# Patient Record
Sex: Female | Born: 1945
Health system: Southern US, Community
[De-identification: ages and names within clinical notes are randomized; demographics above are authoritative.]

## PROBLEM LIST (undated history)

## (undated) DIAGNOSIS — M1990 Unspecified osteoarthritis, unspecified site: Secondary | ICD-10-CM

---

## 1998-06-30 ENCOUNTER — Other Ambulatory Visit: Admission: RE | Admit: 1998-06-30 | Discharge: 1998-06-30 | Payer: Self-pay | Admitting: Family Medicine

## 1999-10-25 ENCOUNTER — Encounter: Payer: Self-pay | Admitting: Orthopedic Surgery

## 1999-11-01 ENCOUNTER — Inpatient Hospital Stay (HOSPITAL_COMMUNITY): Admission: RE | Admit: 1999-11-01 | Discharge: 1999-11-05 | Payer: Self-pay | Admitting: Orthopedic Surgery

## 1999-11-01 ENCOUNTER — Encounter: Payer: Self-pay | Admitting: Orthopedic Surgery

## 2000-11-01 ENCOUNTER — Other Ambulatory Visit: Admission: RE | Admit: 2000-11-01 | Discharge: 2000-11-01 | Payer: Self-pay | Admitting: Family Medicine

## 2002-04-21 ENCOUNTER — Other Ambulatory Visit: Admission: RE | Admit: 2002-04-21 | Discharge: 2002-04-21 | Payer: Self-pay | Admitting: Family Medicine

## 2002-10-24 ENCOUNTER — Encounter: Payer: Self-pay | Admitting: Emergency Medicine

## 2002-10-24 ENCOUNTER — Emergency Department (HOSPITAL_COMMUNITY): Admission: EM | Admit: 2002-10-24 | Discharge: 2002-10-24 | Payer: Self-pay | Admitting: Emergency Medicine

## 2003-06-08 ENCOUNTER — Other Ambulatory Visit: Admission: RE | Admit: 2003-06-08 | Discharge: 2003-06-08 | Payer: Self-pay | Admitting: Family Medicine

## 2004-10-10 ENCOUNTER — Other Ambulatory Visit: Admission: RE | Admit: 2004-10-10 | Discharge: 2004-10-10 | Payer: Self-pay | Admitting: Family Medicine

## 2017-10-07 ENCOUNTER — Emergency Department (HOSPITAL_COMMUNITY)
Admission: EM | Admit: 2017-10-07 | Discharge: 2017-10-07 | Disposition: A | Discharge status: Home / Self Care | Payer: Medicare Other | Attending: Emergency Medicine | Admitting: Emergency Medicine

## 2017-10-07 ENCOUNTER — Emergency Department (HOSPITAL_COMMUNITY): Payer: Medicare Other

## 2017-10-07 ENCOUNTER — Encounter (HOSPITAL_COMMUNITY): Payer: Self-pay | Admitting: Pharmacy Technician

## 2017-10-07 DIAGNOSIS — S0990XA Unspecified injury of head, initial encounter: Secondary | ICD-10-CM

## 2017-10-07 DIAGNOSIS — W010XXA Fall on same level from slipping, tripping and stumbling without subsequent striking against object, initial encounter: Secondary | ICD-10-CM | POA: Insufficient documentation

## 2017-10-07 DIAGNOSIS — R5383 Other fatigue: Secondary | ICD-10-CM | POA: Diagnosis present

## 2017-10-07 DIAGNOSIS — R600 Localized edema: Secondary | ICD-10-CM

## 2017-10-07 DIAGNOSIS — K7469 Other cirrhosis of liver: Secondary | ICD-10-CM

## 2017-10-07 DIAGNOSIS — R2243 Localized swelling, mass and lump, lower limb, bilateral: Secondary | ICD-10-CM | POA: Diagnosis not present

## 2017-10-07 DIAGNOSIS — E876 Hypokalemia: Secondary | ICD-10-CM | POA: Diagnosis not present

## 2017-10-07 DIAGNOSIS — Y929 Unspecified place or not applicable: Secondary | ICD-10-CM | POA: Diagnosis not present

## 2017-10-07 DIAGNOSIS — R7989 Other specified abnormal findings of blood chemistry: Secondary | ICD-10-CM

## 2017-10-07 DIAGNOSIS — Y939 Activity, unspecified: Secondary | ICD-10-CM | POA: Insufficient documentation

## 2017-10-07 DIAGNOSIS — R945 Abnormal results of liver function studies: Secondary | ICD-10-CM | POA: Diagnosis not present

## 2017-10-07 DIAGNOSIS — Y999 Unspecified external cause status: Secondary | ICD-10-CM | POA: Diagnosis not present

## 2017-10-07 LAB — I-STAT TROPONIN, ED: Troponin i, poc: 0.03 ng/mL (ref 0.00–0.08)

## 2017-10-07 LAB — CBC WITH DIFFERENTIAL/PLATELET
BASOS PCT: 0 %
Basophils Absolute: 0 10*3/uL (ref 0.0–0.1)
EOS PCT: 0 %
Eosinophils Absolute: 0 10*3/uL (ref 0.0–0.7)
HEMATOCRIT: 30.4 % — AB (ref 36.0–46.0)
HEMOGLOBIN: 10.3 g/dL — AB (ref 12.0–15.0)
Lymphocytes Relative: 19 %
Lymphs Abs: 2.2 10*3/uL (ref 0.7–4.0)
MCH: 34.7 pg — ABNORMAL HIGH (ref 26.0–34.0)
MCHC: 33.9 g/dL (ref 30.0–36.0)
MCV: 102.4 fL — AB (ref 78.0–100.0)
MONO ABS: 0.8 10*3/uL (ref 0.1–1.0)
MONOS PCT: 7 %
NEUTROS PCT: 74 %
Neutro Abs: 8.7 10*3/uL — ABNORMAL HIGH (ref 1.7–7.7)
Platelets: 168 10*3/uL (ref 150–400)
RBC: 2.97 MIL/uL — AB (ref 3.87–5.11)
RDW: 13.9 % (ref 11.5–15.5)
WBC: 11.7 10*3/uL — AB (ref 4.0–10.5)

## 2017-10-07 LAB — COMPREHENSIVE METABOLIC PANEL
ALT: 34 U/L (ref 14–54)
AST: 77 U/L — AB (ref 15–41)
Albumin: 2.2 g/dL — ABNORMAL LOW (ref 3.5–5.0)
Alkaline Phosphatase: 62 U/L (ref 38–126)
Anion gap: 10 (ref 5–15)
BUN: 13 mg/dL (ref 6–20)
CHLORIDE: 89 mmol/L — AB (ref 101–111)
CO2: 33 mmol/L — AB (ref 22–32)
CREATININE: 1.45 mg/dL — AB (ref 0.44–1.00)
Calcium: 8 mg/dL — ABNORMAL LOW (ref 8.9–10.3)
GFR calc non Af Amer: 36 mL/min — ABNORMAL LOW (ref >60)
GFR, EST AFRICAN AMERICAN: 41 mL/min — AB (ref >60)
Glucose, Bld: 75 mg/dL (ref 65–99)
POTASSIUM: 2.6 mmol/L — AB (ref 3.5–5.1)
SODIUM: 132 mmol/L — AB (ref 135–145)
Total Bilirubin: 5.3 mg/dL — ABNORMAL HIGH (ref 0.3–1.2)
Total Protein: 5 g/dL — ABNORMAL LOW (ref 6.5–8.1)

## 2017-10-07 LAB — BRAIN NATRIURETIC PEPTIDE: B NATRIURETIC PEPTIDE 5: 97.3 pg/mL (ref 0.0–100.0)

## 2017-10-07 MED ORDER — POTASSIUM CHLORIDE CRYS ER 20 MEQ PO TBCR
40.0000 meq | EXTENDED_RELEASE_TABLET | Freq: Once | ORAL | Status: AC
  Administered 2017-10-07: 40 meq via ORAL
  Filled 2017-10-07: qty 2

## 2017-10-07 MED ORDER — POTASSIUM CHLORIDE 10 MEQ/100ML IV SOLN
10.0000 meq | Freq: Once | INTRAVENOUS | Status: AC
  Administered 2017-10-07: 10 meq via INTRAVENOUS
  Filled 2017-10-07: qty 100

## 2017-10-07 MED ORDER — POTASSIUM CHLORIDE ER 20 MEQ PO TBCR: 20.0000 meq | EXTENDED_RELEASE_TABLET | Freq: Every day | ORAL | 0 refills | Status: DC

## 2017-10-07 MED ORDER — FUROSEMIDE 10 MG/ML IJ SOLN
40.0000 mg | Freq: Once | INTRAMUSCULAR | Status: AC
  Administered 2017-10-07: 40 mg via INTRAVENOUS
  Filled 2017-10-07: qty 4

## 2017-10-07 MED ORDER — SPIRONOLACTONE 25 MG PO TABS: 25.0000 mg | ORAL_TABLET | Freq: Every day | ORAL | 0 refills | Status: DC

## 2017-10-07 NOTE — ED Notes (Signed)
Patient transported to Ultrasound 

## 2017-10-07 NOTE — ED Triage Notes (Signed)
Pt brought in by EMS from home with complaints of generalized weakness/fatigue X2 months. [facility] and told she had fluid on her lungs and heart, abnormal ekg (possible PNA??) HR 106, BP 106/70, O2 97% RA. EKG unremarkable with EMS.

## 2017-10-07 NOTE — ED Notes (Signed)
We have sent down labs twice and have tried to call down there numerous times. Labs have been drawn twice now.

## 2017-10-07 NOTE — ED Notes (Signed)
Pt is on the female catheter and needs to stay in room until daughter gets here.

## 2017-10-07 NOTE — ED Provider Notes (Signed)
  Physical Exam  BP 109/69   Pulse 93   Resp 12   SpO2 94%   Physical Exam  ED Course/Procedures     Procedures  MDM  Care assumed at 5 pm from Dr. Laverta Baltimore. Patient brought in by family for generalized weakness as well as leg swelling.  Sent in from urgent care for abnormal EKG as well as possible pleural effusion.  Signed out pending labs and reassessment.  7 pm Labs showed K 2.6, albumin 2.2, Bili 5, nl AST/ALT/Alk Phos. BNP normal. Patient was a former alcoholic and stopped drinking 5 years ago. I am wondering if she has cirrhosis causing her symptoms. Will get RUQ [place].   9:34 PM RUQ US showed ascites. Abdomen nontender so I doubt acute chole. She is eating and drinking well. Potassium supplemented. I called Dr. Collene Mares from GI regarding follow up and treatment. Will start on spironolactone for leg swelling and low dose potassium since her potassium is 2.6. Will have her follow up with Dr. Collene Mares in office for further workup.       Drenda Freeze, MD 10/07/17 2135

## 2017-10-07 NOTE — ED Provider Notes (Signed)
Emergency Department Provider Note   I have reviewed the triage vital signs and the nursing notes.   HISTORY  Chief Complaint Fatigue   HPI Samantha Gentry is a 72 y.o. female presents to the emergency department for evaluation of generalized weakness worsening over the past 2 months.  Patient has noticed approximately 4 months of worsening lower extremity swelling bilaterally.  She states that because of her swelling she has difficulty walking at times and has had several falls resulting in head trauma.  No loss of consciousness.  She denies shortness of breath or chest pain.  No fevers or chills.  No vomiting or diarrhea.  She has no primary care physician.  She went to urgent care today where evaluation was done showing that she might have fluid on her lungs, around her heart, and was told that she had an abnormal EKG.    History reviewed. No pertinent past medical history.  There are no active problems to display for this patient.   History reviewed. No pertinent surgical history.    Allergies Patient has no allergy information on record.  No family history on file.  Social History Social History   Tobacco Use  . Smoking status: Not on file  Substance Use Topics  . Alcohol use: Not on file  . Drug use: Not on file    Review of Systems  Constitutional: No fever/chills. Positive fatigue.  Eyes: No visual changes. ENT: No sore throat. Cardiovascular: Denies chest pain. Respiratory: Denies shortness of breath. Gastrointestinal: No abdominal pain.  No nausea, no vomiting.  No diarrhea.  No constipation. Genitourinary: Negative for dysuria. Musculoskeletal: Negative for back pain. Positive LE edema.  Skin: Negative for rash. Neurological: Negative for headaches, focal weakness or numbness.  10-point ROS otherwise negative.  ____________________________________________   PHYSICAL EXAM:  VITAL SIGNS: Vitals:   10/07/17 1715 10/07/17 1745  BP: 107/68  103/78  Pulse: 96 84  Resp: 19 (!) 25  SpO2: 95%     Constitutional: Alert and oriented. Well appearing and in no acute distress. Eyes: Conjunctivae are normal.  Head: Atraumatic. Nose: No congestion/rhinnorhea. Mouth/Throat: Mucous membranes are slightly dry.  Neck: No stridor.   Cardiovascular: Normal rate, regular rhythm. Good peripheral circulation. Grossly normal heart sounds.   Respiratory: Normal respiratory effort. No retractions. Lungs CTAB. Gastrointestinal: Soft and nontender. No distention.  Musculoskeletal: No lower extremity tenderness. 3+ pitting edema bilaterally to the knees. No gross deformities of extremities. Neurologic:  Normal speech and language. No gross focal neurologic deficits are appreciated.  Skin:  Skin is warm, dry and intact. No rash noted.  ____________________________________________   LABS (all labs ordered are listed, but only abnormal results are displayed)  Labs Reviewed - No data to display ____________________________________________  EKG   EKG Interpretation  Date/Time:  Monday October 07 2017 14:08:53 EST Ventricular Rate:  100 PR Interval:    QRS Duration: 57 QT Interval:  406 QTC Calculation: 524 R Axis:   -36 Text Interpretation:  Sinus tachycardia Probable left atrial enlargement Left axis deviation Low voltage, precordial leads Consider anterolateral infarct Prolonged QT interval Baseline wander in lead(s) V3 No STEMI.  Confirmed by Alona BeneLong, Macayla Ekdahl [phone] on 10/07/2017 2:18:35 PM       ____________________________________________  RADIOLOGY  Dg Chest 2 View  Result Date: 10/07/2017 CLINICAL DATA:  72 year old female with leg swelling. Falls. Confused. Initial encounter. EXAM: CHEST  2 VIEW COMPARISON:  None. FINDINGS: Elevated right hemidiaphragm.  Right base subsegmental atelectasis. Small pleural effusion  with blunting of the posterior sulcus. No frank pulmonary edema or segmental consolidation. Cardiomegaly suspected.  Calcified aorta. Degenerative changes most notable lower thoracic spine. Acromioclavicular joint degenerative changes. IMPRESSION: Elevated right hemidiaphragm.  Right base subsegmental atelectasis. Small pleural effusion with blunting of the posterior sulcus. No frank pulmonary edema or segmental consolidation. Cardiomegaly suspected. Aortic Atherosclerosis (ICD10-I70.0). Electronically Signed   By: Lacy Duverney M.D.   On: 10/07/2017 14:51   Ct Head Wo Contrast  Result Date: 10/07/2017 CLINICAL DATA:  Multiple falls over the past few weeks EXAM: CT HEAD WITHOUT CONTRAST TECHNIQUE: Contiguous axial images were obtained from the base of the skull through the vertex without intravenous contrast. COMPARISON:  None. FINDINGS: Brain: Generalized atrophic changes are noted. Diffuse decreased attenuation is noted in the deep white matter consistent with chronic white matter ischemic change. No findings to suggest acute hemorrhage, acute infarction or space-occupying mass lesion are seen. Vascular: No hyperdense vessel or unexpected calcification. Skull: Normal. Negative for fracture or focal lesion. Sinuses/Orbits: No acute finding. Other: None. IMPRESSION: Chronic atrophic change and chronic white matter ischemic change. No acute abnormality noted. Electronically Signed   By: Alcide Clever M.D.   On: 10/07/2017 15:04    ____________________________________________   PROCEDURES  Procedure(s) performed:   Procedures  None ____________________________________________   INITIAL IMPRESSION / ASSESSMENT AND PLAN / ED COURSE  Pertinent labs & imaging results that were available during my care of the patient were reviewed by me and considered in my medical decision making (see chart for details).  Patient presents to the emergency department for evaluation of generalized weakness and report of abnormal x-ray and EKG at urgent care.  Patient afebrile here with largely normal vital signs.  She has  significant lower extremity edema.  Normal neurological exam.  She had multiple instances of mechanical fall with head trauma.  Plan for CT imaging to rule out subacute subdural hematoma.  Also will repeat chest x-ray, labs, and reassess.  Imaging and troponin negative. Labs pending. Care transferred to Dr. Silverio Lay. If labs are largely unremarkable would consider outpatient CHF evaluation with Cardiology referral and lasix for home use.  ____________________________________________  FINAL CLINICAL IMPRESSION(S) / ED DIAGNOSES  Final diagnoses:  Elevated LFTs  Fatigue, unspecified type  Bilateral lower extremity edema  Injury of head, initial encounter    Note:  This document was prepared using Dragon voice recognition software and may include unintentional dictation errors.  Alona Bene, MD Emergency Medicine    Jheremy Boger, Arlyss Repress, MD 10/07/17 [phone]

## 2017-10-07 NOTE — Discharge Instructions (Signed)
Take spironolactone daily to help you with swelling.   Take potassium 20 meq daily to get your potassium up. Eat foods with more beans.   You have cirrhosis of your liver. You need to call Dr. Kenna GilbertMann's office tomorrow to get appointment for follow up   You need to have your doctor recheck your liver function and kidney function and potassium in a week   Return to ER if you have worse leg swelling, abdominal pain, vomiting, chest pain, trouble breathing

## 2018-03-30 IMAGING — US US ABDOMEN LIMITED
1 series · 14 of 25 positions shown · non-contrast
Comparison: None.

CLINICAL DATA: Elevated liver function tests.

EXAM:
ULTRASOUND ABDOMEN LIMITED RIGHT UPPER QUADRANT

[Series 1: us abdomen limited · 0.22mm/px · 14 of 53 slices shown]
[im 1/53]
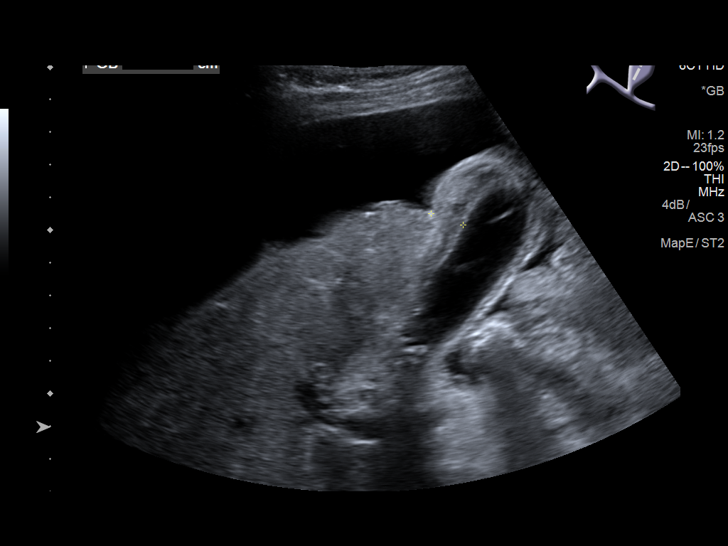
[im 5/53]
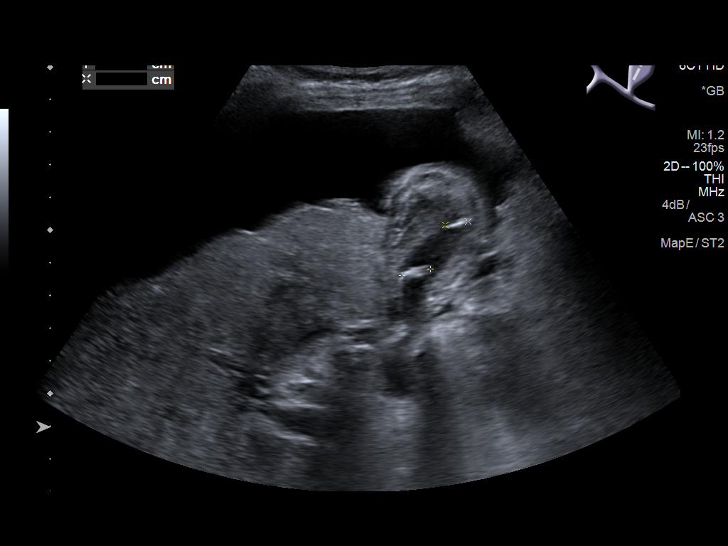
[im 9/53]
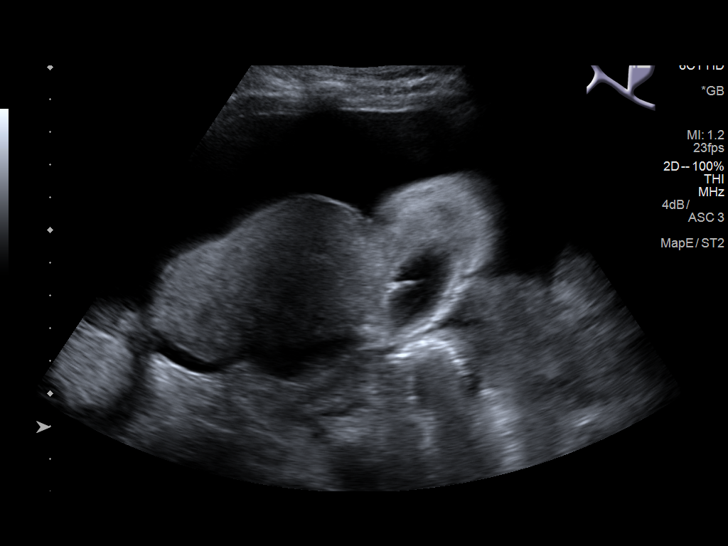
[im 14/53]
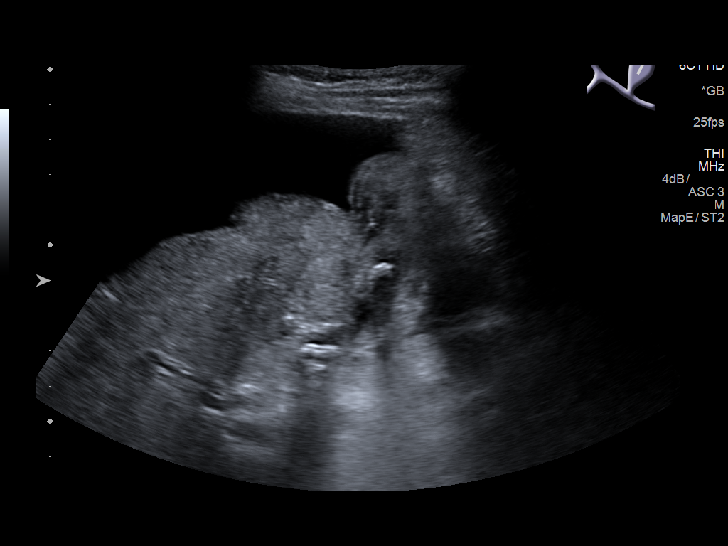
[im 18/53]
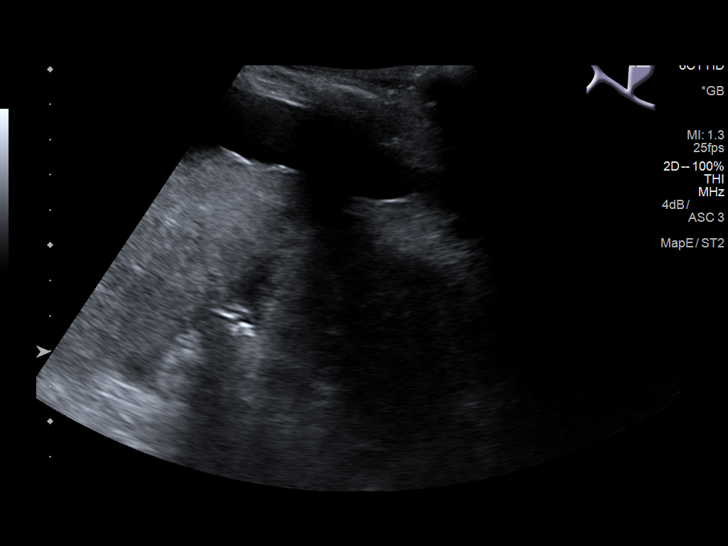
[im 20/53]
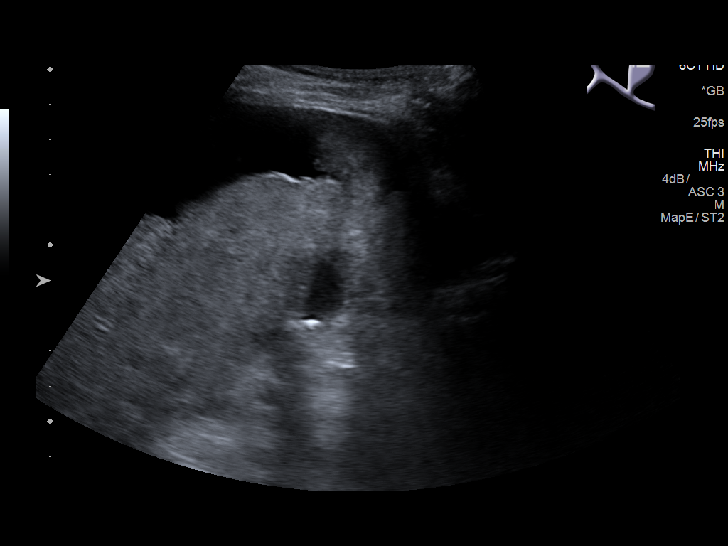
[im 24/53]
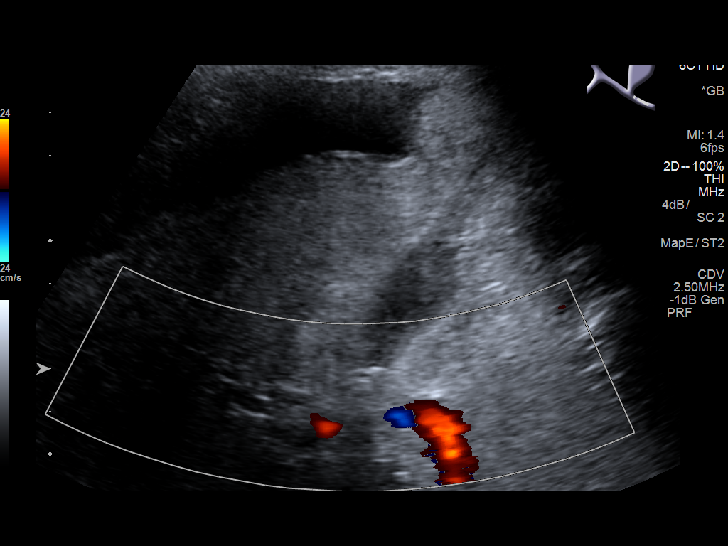
[im 29/53]
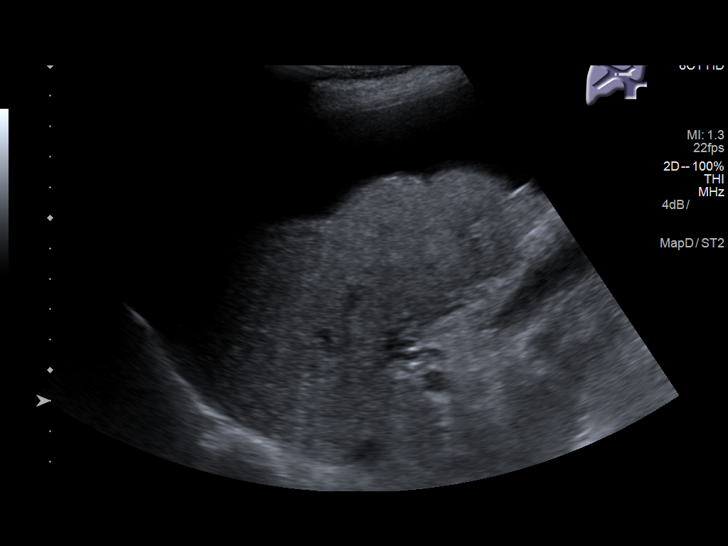
[im 33/53]
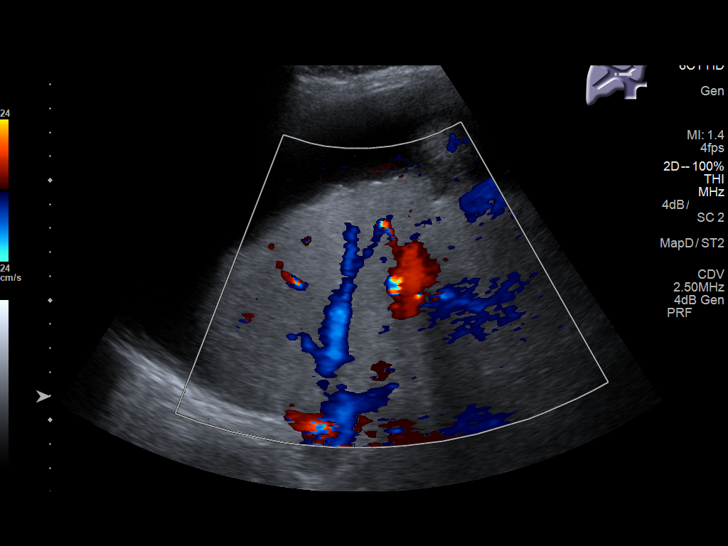
[im 35/53]
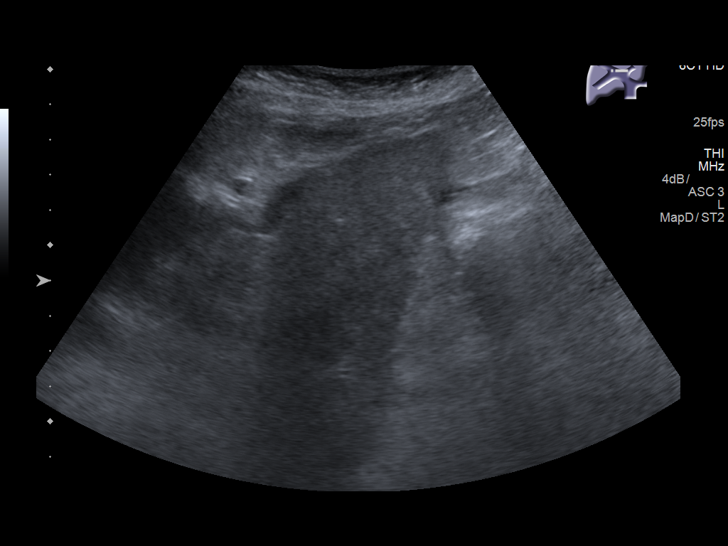
[im 40/53]
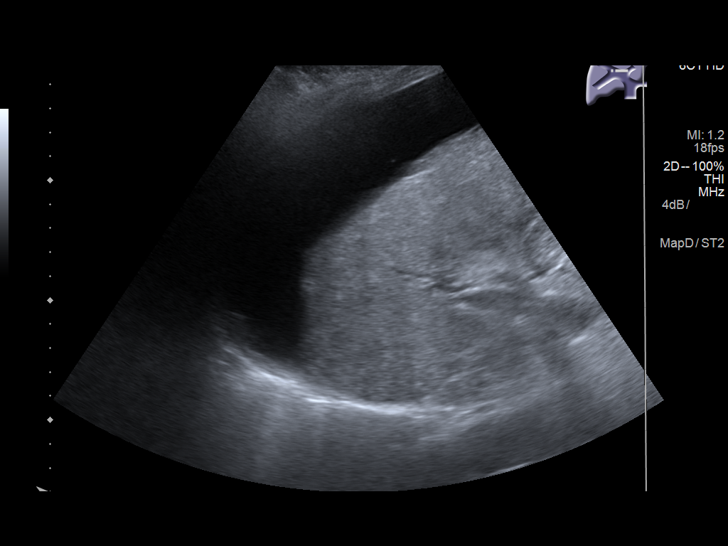
[im 44/53]
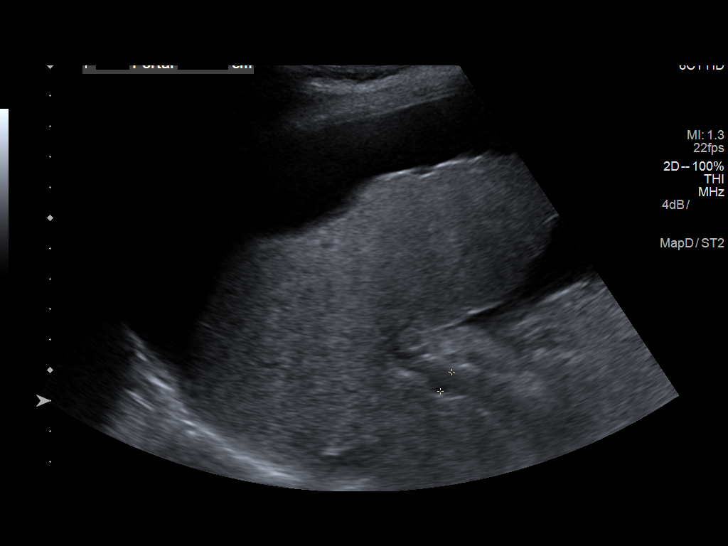
[im 48/53]
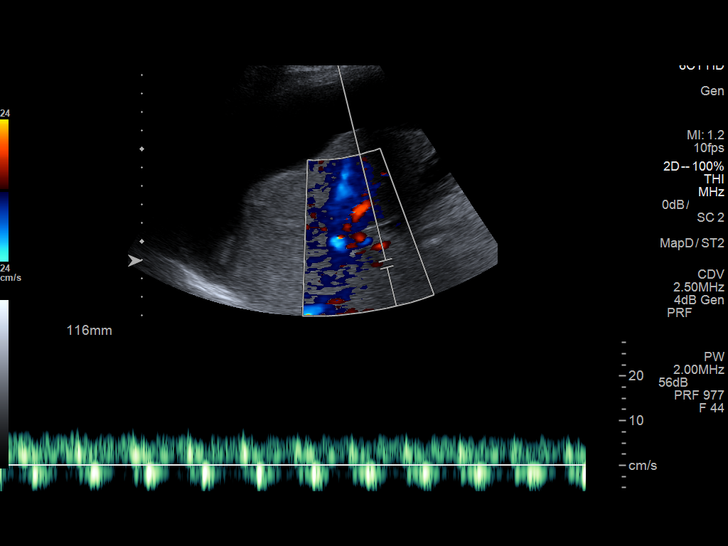
[im 53/53]
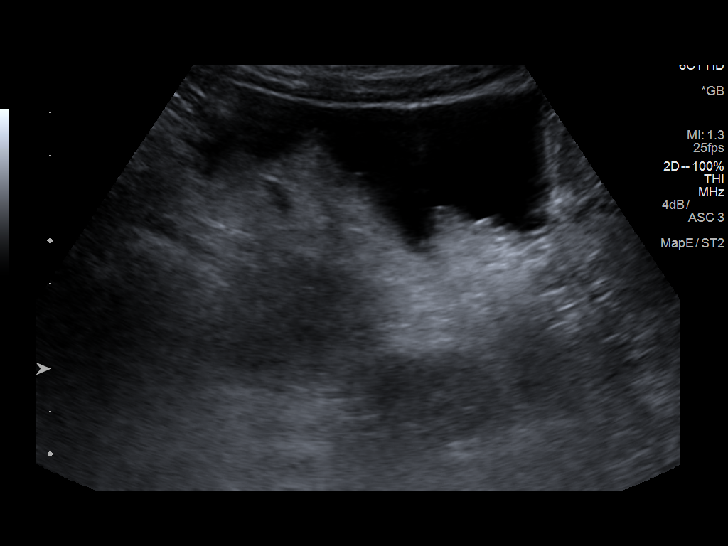

[14 of 25 positions shown; findings below may reference images not displayed]

FINDINGS: Gallbladder:

The gallbladder is incompletely distended. There are multiple
gallbladder stones the largest measuring 9 mm. There is marked
gallbladder wall thickening with maximum transverse diameter of
mm. The sonographic Murphy's sign was reported as negative.

Common bile duct:

Diameter: 4.2 mm

Liver:

Coarse increased echogenicity and lobulated borders. Portal vein is
patent on color Doppler imaging with bidirectional blood flow.
Moderate amount of ascites are seen in all 4 quadrants of the
abdomen.
IMPRESSION: Cholelithiasis. Marked edema of the gallbladder wall may be due to
acute cholecystitis or may be reactive due to presence of ascites.
Please correlate clinically.

Cirrhotic appearance of the liver. Bidirectional flow of the main
portal vein.

Moderate in volume abdominal ascites.

## 2018-10-08 ENCOUNTER — Other Ambulatory Visit: Payer: Self-pay | Admitting: Gastroenterology

## 2018-10-08 DIAGNOSIS — R188 Other ascites: Secondary | ICD-10-CM

## 2018-10-13 ENCOUNTER — Ambulatory Visit
Admission: RE | Admit: 2018-10-13 | Discharge: 2018-10-13 | Disposition: A | Discharge status: Home / Self Care | Payer: Medicare Other | Source: Ambulatory Visit | Attending: Gastroenterology | Admitting: Gastroenterology

## 2019-08-02 IMAGING — US US ABDOMEN LIMITED
1 series · 11 of 11 positions shown · non-contrast
Comparison: None.

CLINICAL DATA: Evaluate for ascites.

EXAM:
LIMITED ABDOMEN ULTRASOUND FOR ASCITES
TECHNIQUE: Limited ultrasound survey for ascites was performed in all four
abdominal quadrants.

[Series 1: us abdomen limited · 0.25mm/px · 11 of 11 slices shown]
[im 1/11]
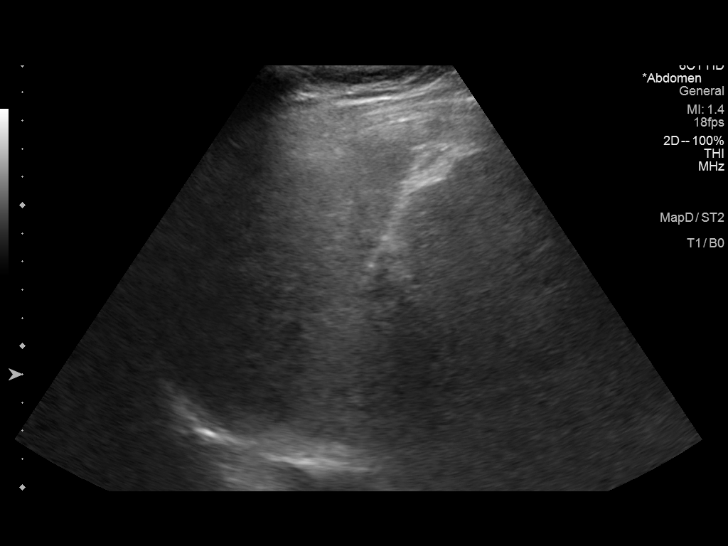
[im 2/11]
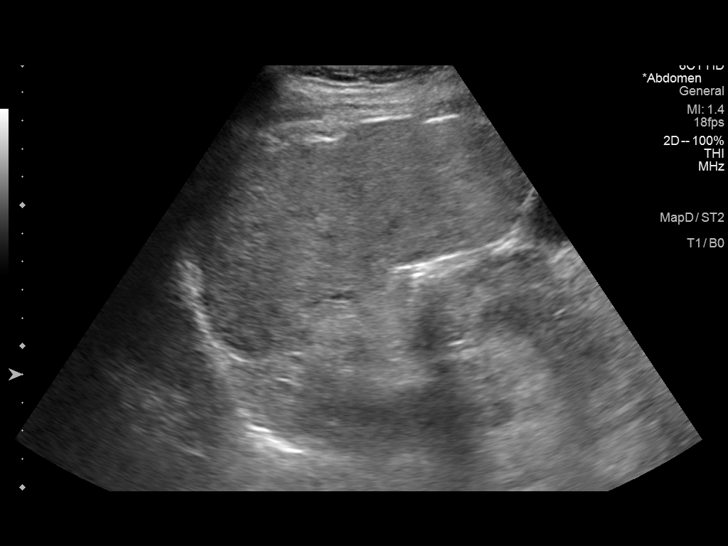
[im 3/11]
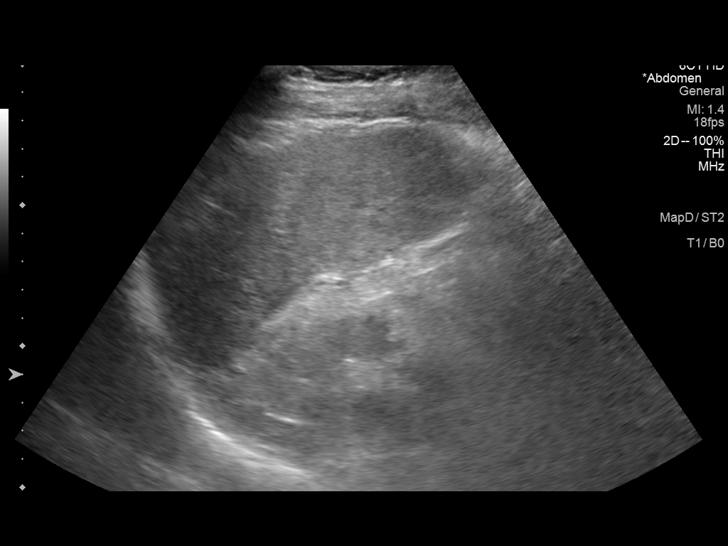
[im 4/11]
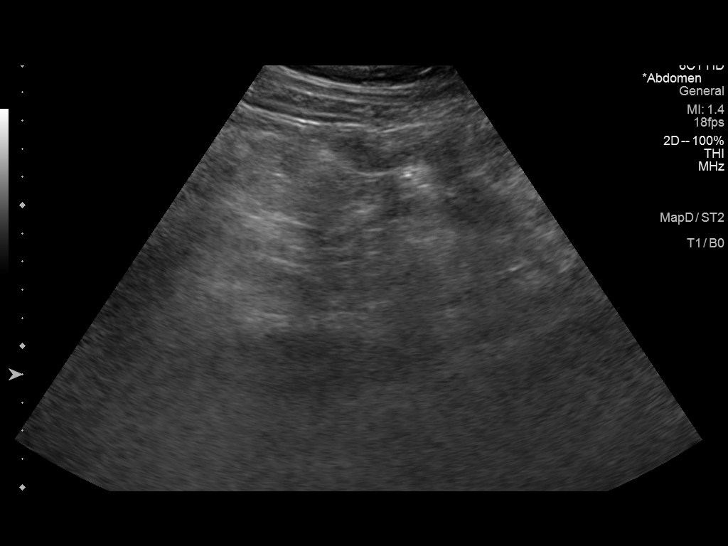
[im 5/11]
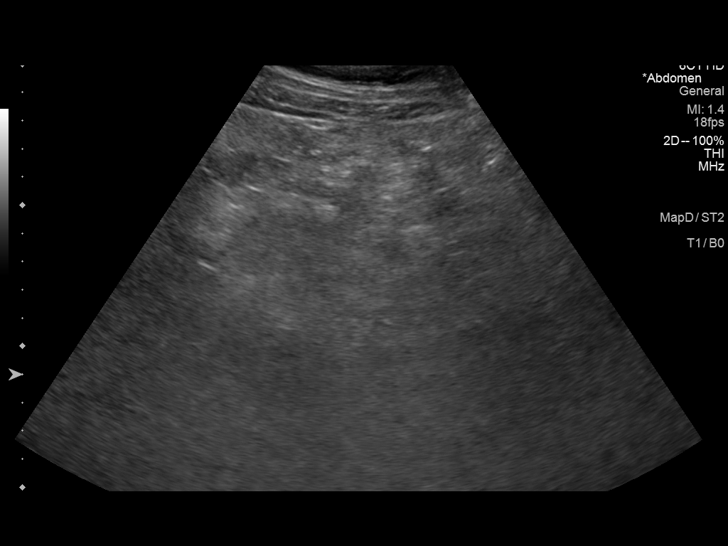
[im 6/11]
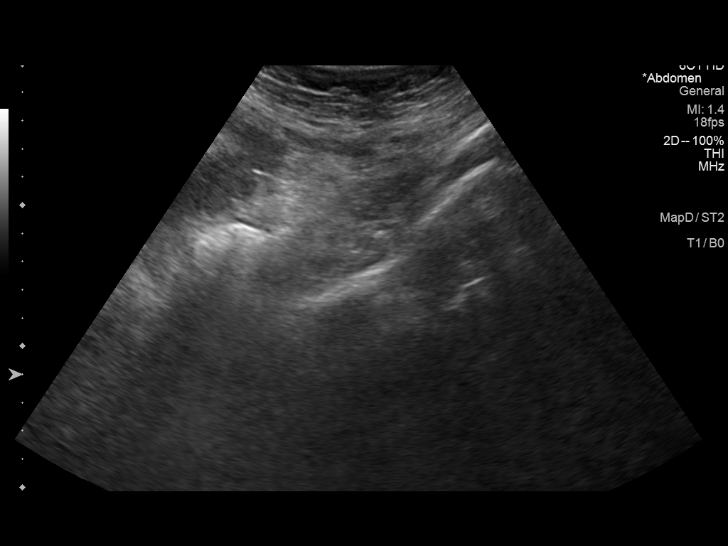
[im 7/11]
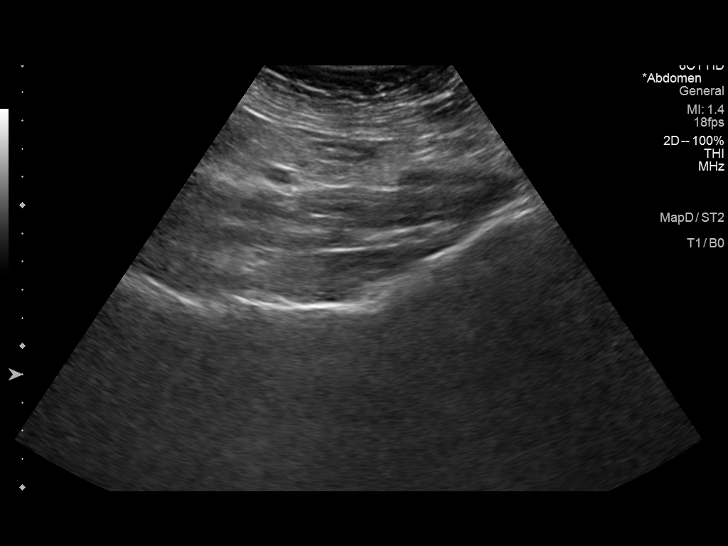
[im 8/11]
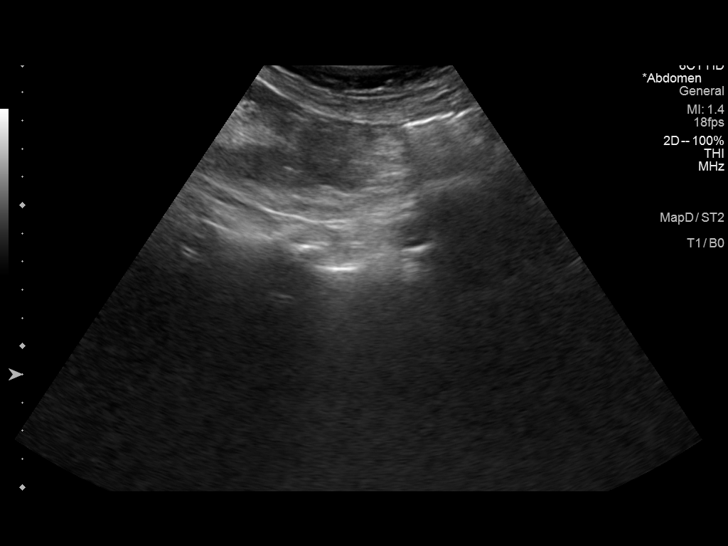
[im 9/11]
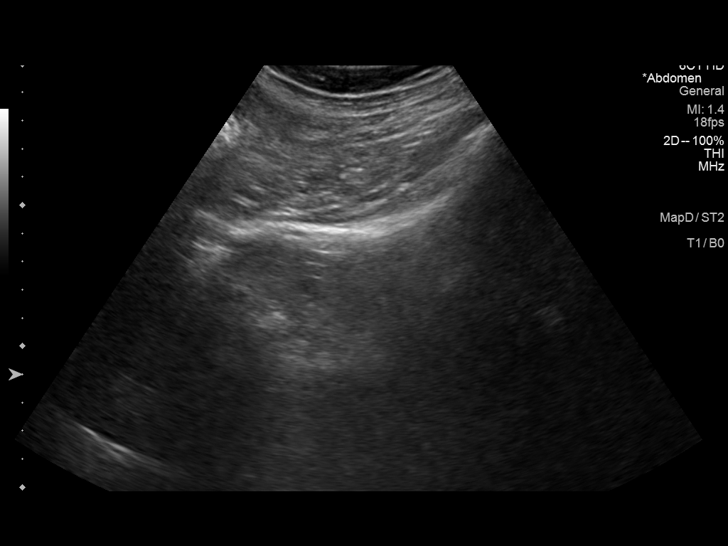
[im 10/11]
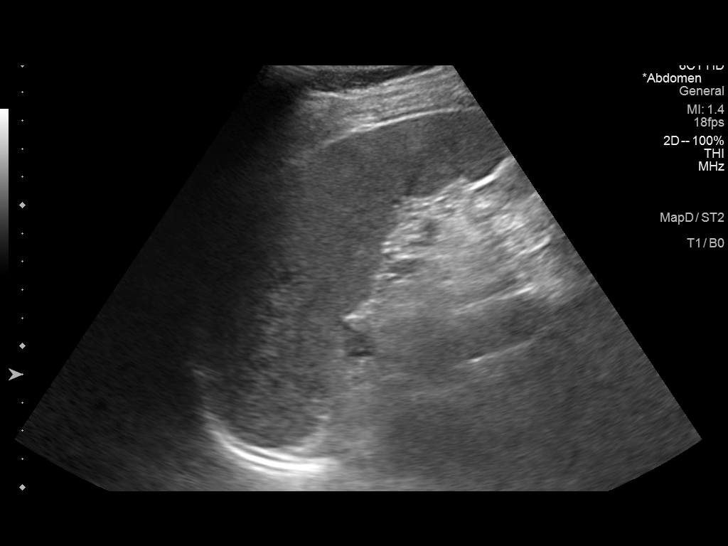
[im 11/11]
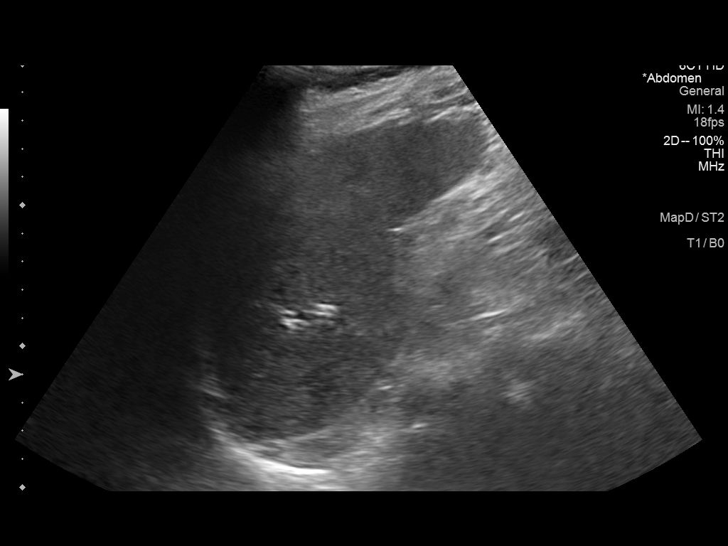

[11 of 11 positions shown; findings below may reference images not displayed]

FINDINGS: No ascites identified.
IMPRESSION: No ascites identified.

## 2019-12-08 ENCOUNTER — Other Ambulatory Visit: Payer: Self-pay | Admitting: Gastroenterology

## 2019-12-08 DIAGNOSIS — R188 Other ascites: Secondary | ICD-10-CM

## 2019-12-08 DIAGNOSIS — K7460 Unspecified cirrhosis of liver: Secondary | ICD-10-CM

## 2019-12-16 ENCOUNTER — Ambulatory Visit
Admission: RE | Admit: 2019-12-16 | Discharge: 2019-12-16 | Disposition: A | Discharge status: Home / Self Care | Payer: Medicare Other | Source: Ambulatory Visit | Attending: Gastroenterology | Admitting: Gastroenterology

## 2019-12-16 DIAGNOSIS — K7460 Unspecified cirrhosis of liver: Secondary | ICD-10-CM

## 2019-12-16 DIAGNOSIS — R188 Other ascites: Secondary | ICD-10-CM

## 2020-12-28 ENCOUNTER — Other Ambulatory Visit: Payer: Self-pay | Admitting: Gastroenterology

## 2020-12-30 ENCOUNTER — Other Ambulatory Visit: Payer: Self-pay | Admitting: Gastroenterology

## 2020-12-30 DIAGNOSIS — R188 Other ascites: Secondary | ICD-10-CM

## 2021-01-13 ENCOUNTER — Other Ambulatory Visit: Payer: Self-pay

## 2021-01-13 ENCOUNTER — Ambulatory Visit
Admission: RE | Admit: 2021-01-13 | Discharge: 2021-01-13 | Disposition: A | Discharge status: Home / Self Care | Payer: Medicare Other | Source: Ambulatory Visit | Attending: Gastroenterology | Admitting: Gastroenterology

## 2021-01-13 DIAGNOSIS — R188 Other ascites: Secondary | ICD-10-CM

## 2023-01-17 ENCOUNTER — Other Ambulatory Visit: Payer: Self-pay | Admitting: Gastroenterology

## 2023-01-17 DIAGNOSIS — K7469 Other cirrhosis of liver: Secondary | ICD-10-CM

## 2023-01-28 ENCOUNTER — Emergency Department (HOSPITAL_COMMUNITY): Payer: Medicare Other

## 2023-01-28 ENCOUNTER — Other Ambulatory Visit: Payer: Self-pay

## 2023-01-28 ENCOUNTER — Encounter (HOSPITAL_COMMUNITY): Payer: Self-pay

## 2023-01-28 ENCOUNTER — Inpatient Hospital Stay (HOSPITAL_COMMUNITY)
Admission: EM | Admit: 2023-01-28 | Discharge: 2023-03-02 | DRG: 871 | Disposition: E | Payer: Medicare Other | Attending: Pulmonary Disease | Admitting: Pulmonary Disease

## 2023-01-28 DIAGNOSIS — Z7189 Other specified counseling: Secondary | ICD-10-CM | POA: Diagnosis not present

## 2023-01-28 DIAGNOSIS — Z66 Do not resuscitate: Secondary | ICD-10-CM | POA: Diagnosis not present

## 2023-01-28 DIAGNOSIS — R6521 Severe sepsis with septic shock: Secondary | ICD-10-CM | POA: Diagnosis present

## 2023-01-28 DIAGNOSIS — E8770 Fluid overload, unspecified: Secondary | ICD-10-CM | POA: Diagnosis present

## 2023-01-28 DIAGNOSIS — R112 Nausea with vomiting, unspecified: Secondary | ICD-10-CM | POA: Diagnosis present

## 2023-01-28 DIAGNOSIS — D62 Acute posthemorrhagic anemia: Secondary | ICD-10-CM | POA: Diagnosis present

## 2023-01-28 DIAGNOSIS — E162 Hypoglycemia, unspecified: Secondary | ICD-10-CM | POA: Diagnosis present

## 2023-01-28 DIAGNOSIS — K7040 Alcoholic hepatic failure without coma: Secondary | ICD-10-CM | POA: Diagnosis present

## 2023-01-28 DIAGNOSIS — E871 Hypo-osmolality and hyponatremia: Secondary | ICD-10-CM | POA: Diagnosis present

## 2023-01-28 DIAGNOSIS — A419 Sepsis, unspecified organism: Secondary | ICD-10-CM | POA: Diagnosis present

## 2023-01-28 DIAGNOSIS — F17210 Nicotine dependence, cigarettes, uncomplicated: Secondary | ICD-10-CM | POA: Diagnosis present

## 2023-01-28 DIAGNOSIS — C220 Liver cell carcinoma: Secondary | ICD-10-CM | POA: Diagnosis present

## 2023-01-28 DIAGNOSIS — R6510 Systemic inflammatory response syndrome (SIRS) of non-infectious origin without acute organ dysfunction: Secondary | ICD-10-CM | POA: Diagnosis present

## 2023-01-28 DIAGNOSIS — J9691 Respiratory failure, unspecified with hypoxia: Secondary | ICD-10-CM | POA: Diagnosis not present

## 2023-01-28 DIAGNOSIS — Z515 Encounter for palliative care: Secondary | ICD-10-CM | POA: Diagnosis not present

## 2023-01-28 DIAGNOSIS — R0609 Other forms of dyspnea: Secondary | ICD-10-CM | POA: Diagnosis not present

## 2023-01-28 DIAGNOSIS — R188 Other ascites: Secondary | ICD-10-CM | POA: Diagnosis not present

## 2023-01-28 DIAGNOSIS — K921 Melena: Secondary | ICD-10-CM | POA: Diagnosis present

## 2023-01-28 DIAGNOSIS — J449 Chronic obstructive pulmonary disease, unspecified: Secondary | ICD-10-CM | POA: Diagnosis present

## 2023-01-28 DIAGNOSIS — I471 Supraventricular tachycardia, unspecified: Secondary | ICD-10-CM | POA: Diagnosis not present

## 2023-01-28 DIAGNOSIS — E872 Acidosis, unspecified: Secondary | ICD-10-CM | POA: Diagnosis present

## 2023-01-28 DIAGNOSIS — R579 Shock, unspecified: Secondary | ICD-10-CM | POA: Diagnosis not present

## 2023-01-28 DIAGNOSIS — K652 Spontaneous bacterial peritonitis: Secondary | ICD-10-CM | POA: Diagnosis present

## 2023-01-28 DIAGNOSIS — F1011 Alcohol abuse, in remission: Secondary | ICD-10-CM | POA: Diagnosis present

## 2023-01-28 DIAGNOSIS — M7989 Other specified soft tissue disorders: Secondary | ICD-10-CM | POA: Diagnosis present

## 2023-01-28 DIAGNOSIS — L8990 Pressure ulcer of unspecified site, unspecified stage: Secondary | ICD-10-CM | POA: Insufficient documentation

## 2023-01-28 DIAGNOSIS — J9621 Acute and chronic respiratory failure with hypoxia: Secondary | ICD-10-CM | POA: Diagnosis not present

## 2023-01-28 DIAGNOSIS — D65 Disseminated intravascular coagulation [defibrination syndrome]: Secondary | ICD-10-CM | POA: Diagnosis present

## 2023-01-28 DIAGNOSIS — K760 Fatty (change of) liver, not elsewhere classified: Secondary | ICD-10-CM | POA: Diagnosis present

## 2023-01-28 DIAGNOSIS — K7031 Alcoholic cirrhosis of liver with ascites: Secondary | ICD-10-CM | POA: Diagnosis present

## 2023-01-28 DIAGNOSIS — Z716 Tobacco abuse counseling: Secondary | ICD-10-CM

## 2023-01-28 DIAGNOSIS — R918 Other nonspecific abnormal finding of lung field: Secondary | ICD-10-CM | POA: Diagnosis present

## 2023-01-28 DIAGNOSIS — C799 Secondary malignant neoplasm of unspecified site: Secondary | ICD-10-CM | POA: Diagnosis present

## 2023-01-28 DIAGNOSIS — G9341 Metabolic encephalopathy: Secondary | ICD-10-CM | POA: Diagnosis not present

## 2023-01-28 DIAGNOSIS — Z681 Body mass index (BMI) 19 or less, adult: Secondary | ICD-10-CM

## 2023-01-28 DIAGNOSIS — L89892 Pressure ulcer of other site, stage 2: Secondary | ICD-10-CM | POA: Diagnosis present

## 2023-01-28 DIAGNOSIS — K7460 Unspecified cirrhosis of liver: Secondary | ICD-10-CM | POA: Diagnosis present

## 2023-01-28 DIAGNOSIS — Z79899 Other long term (current) drug therapy: Secondary | ICD-10-CM

## 2023-01-28 DIAGNOSIS — D689 Coagulation defect, unspecified: Secondary | ICD-10-CM | POA: Diagnosis present

## 2023-01-28 DIAGNOSIS — D509 Iron deficiency anemia, unspecified: Secondary | ICD-10-CM | POA: Diagnosis present

## 2023-01-28 DIAGNOSIS — R339 Retention of urine, unspecified: Secondary | ICD-10-CM | POA: Diagnosis not present

## 2023-01-28 DIAGNOSIS — N179 Acute kidney failure, unspecified: Secondary | ICD-10-CM | POA: Diagnosis present

## 2023-01-28 DIAGNOSIS — R159 Full incontinence of feces: Secondary | ICD-10-CM | POA: Diagnosis present

## 2023-01-28 DIAGNOSIS — R34 Anuria and oliguria: Secondary | ICD-10-CM | POA: Diagnosis not present

## 2023-01-28 DIAGNOSIS — E43 Unspecified severe protein-calorie malnutrition: Secondary | ICD-10-CM | POA: Diagnosis present

## 2023-01-28 DIAGNOSIS — M1990 Unspecified osteoarthritis, unspecified site: Secondary | ICD-10-CM | POA: Diagnosis present

## 2023-01-28 HISTORY — DX: Unspecified osteoarthritis, unspecified site: M19.90

## 2023-01-28 LAB — AMMONIA: Ammonia: 23 umol/L (ref 9–35)

## 2023-01-28 LAB — PHOSPHORUS: Phosphorus: 4.7 mg/dL — ABNORMAL HIGH (ref 2.5–4.6)

## 2023-01-28 LAB — CBG MONITORING, ED
Glucose-Capillary: 85 mg/dL (ref 70–99)
Glucose-Capillary: 73 mg/dL (ref 70–99)

## 2023-01-28 LAB — COMPREHENSIVE METABOLIC PANEL
ALT: 47 U/L — ABNORMAL HIGH (ref 0–44)
AST: 76 U/L — ABNORMAL HIGH (ref 15–41)
Albumin: 1.8 g/dL — ABNORMAL LOW (ref 3.5–5.0)
Alkaline Phosphatase: 92 U/L (ref 38–126)
Anion gap: 22 — ABNORMAL HIGH (ref 5–15)
BUN: 34 mg/dL — ABNORMAL HIGH (ref 8–23)
CO2: 13 mmol/L — ABNORMAL LOW (ref 22–32)
Calcium: 8.2 mg/dL — ABNORMAL LOW (ref 8.9–10.3)
Chloride: 94 mmol/L — ABNORMAL LOW (ref 98–111)
Creatinine, Ser: 2.29 mg/dL — ABNORMAL HIGH (ref 0.44–1.00)
GFR, Estimated: 22 mL/min — ABNORMAL LOW (ref >60)
Glucose, Bld: 50 mg/dL — ABNORMAL LOW (ref 70–99)
Potassium: 4.8 mmol/L (ref 3.5–5.1)
Sodium: 129 mmol/L — ABNORMAL LOW (ref 135–145)
Total Bilirubin: 3.5 mg/dL — ABNORMAL HIGH (ref 0.3–1.2)
Total Protein: 5.1 g/dL — ABNORMAL LOW (ref 6.5–8.1)

## 2023-01-28 LAB — CK: Total CK: 127 U/L (ref 38–234)

## 2023-01-28 LAB — VITAMIN B12: Vitamin B-12: 1479 pg/mL — ABNORMAL HIGH (ref 180–914)

## 2023-01-28 LAB — LACTIC ACID, PLASMA
Lactic Acid, Venous: >9 mmol/L (ref 0.5–1.9)
Lactic Acid, Venous: >9 mmol/L (ref 0.5–1.9)

## 2023-01-28 LAB — I-STAT VENOUS BLOOD GAS, ED
Acid-base deficit: 10 mmol/L — ABNORMAL HIGH (ref 0.0–2.0)
Bicarbonate: 14.1 mmol/L — ABNORMAL LOW (ref 20.0–28.0)
Calcium, Ion: 1.02 mmol/L — ABNORMAL LOW (ref 1.15–1.40)
HCT: 22 % — ABNORMAL LOW (ref 36.0–46.0)
Hemoglobin: 7.5 g/dL — ABNORMAL LOW (ref 12.0–15.0)
O2 Saturation: 93 %
Potassium: 4.9 mmol/L (ref 3.5–5.1)
Sodium: 129 mmol/L — ABNORMAL LOW (ref 135–145)
TCO2: 15 mmol/L — ABNORMAL LOW (ref 22–32)
pCO2, Ven: 24.1 mmHg — ABNORMAL LOW (ref 44–60)
pH, Ven: 7.376 (ref 7.25–7.43)
pO2, Ven: 67 mmHg — ABNORMAL HIGH (ref 32–45)

## 2023-01-28 LAB — FERRITIN: Ferritin: 61 ng/mL (ref 11–307)

## 2023-01-28 LAB — CBC WITH DIFFERENTIAL/PLATELET
Abs Immature Granulocytes: 0 10*3/uL (ref 0.00–0.07)
Basophils Absolute: 0 10*3/uL (ref 0.0–0.1)
Basophils Relative: 0 %
Eosinophils Absolute: 0 10*3/uL (ref 0.0–0.5)
Eosinophils Relative: 0 %
HCT: 26.3 % — ABNORMAL LOW (ref 36.0–46.0)
Hemoglobin: 7.8 g/dL — ABNORMAL LOW (ref 12.0–15.0)
Lymphocytes Relative: 3 %
Lymphs Abs: 0.9 10*3/uL (ref 0.7–4.0)
MCH: 22.9 pg — ABNORMAL LOW (ref 26.0–34.0)
MCHC: 29.7 g/dL — ABNORMAL LOW (ref 30.0–36.0)
MCV: 77.1 fL — ABNORMAL LOW (ref 80.0–100.0)
Monocytes Absolute: 0.9 10*3/uL (ref 0.1–1.0)
Monocytes Relative: 3 %
Neutro Abs: 27.2 10*3/uL — ABNORMAL HIGH (ref 1.7–7.7)
Neutrophils Relative %: 94 %
Platelets: 232 10*3/uL (ref 150–400)
RBC: 3.41 MIL/uL — ABNORMAL LOW (ref 3.87–5.11)
RDW: 21 % — ABNORMAL HIGH (ref 11.5–15.5)
WBC: 28.9 10*3/uL — ABNORMAL HIGH (ref 4.0–10.5)
nRBC: 0 % (ref 0.0–0.2)
nRBC: 0 /100 WBC

## 2023-01-28 LAB — BLOOD GAS, VENOUS
Acid-base deficit: 7.8 mmol/L — ABNORMAL HIGH (ref 0.0–2.0)
Bicarbonate: 17.6 mmol/L — ABNORMAL LOW (ref 20.0–28.0)
O2 Saturation: 21.5 %
Patient temperature: 37
pCO2, Ven: 35 mmHg — ABNORMAL LOW (ref 44–60)
pH, Ven: 7.31 (ref 7.25–7.43)
pO2, Ven: <31 mmHg — CL (ref 32–45)

## 2023-01-28 LAB — HEMOGLOBIN A1C
Hgb A1c MFr Bld: 4.4 % — ABNORMAL LOW (ref 4.8–5.6)
Mean Plasma Glucose: 79.58 mg/dL

## 2023-01-28 LAB — BRAIN NATRIURETIC PEPTIDE: B Natriuretic Peptide: 176.3 pg/mL — ABNORMAL HIGH (ref 0.0–100.0)

## 2023-01-28 LAB — I-STAT CHEM 8, ED
BUN: 34 mg/dL — ABNORMAL HIGH (ref 8–23)
Calcium, Ion: 1.01 mmol/L — ABNORMAL LOW (ref 1.15–1.40)
Chloride: 100 mmol/L (ref 98–111)
Creatinine, Ser: 2.3 mg/dL — ABNORMAL HIGH (ref 0.44–1.00)
Glucose, Bld: 44 mg/dL — CL (ref 70–99)
HCT: 21 % — ABNORMAL LOW (ref 36.0–46.0)
Hemoglobin: 7.1 g/dL — ABNORMAL LOW (ref 12.0–15.0)
Potassium: 4.8 mmol/L (ref 3.5–5.1)
Sodium: 129 mmol/L — ABNORMAL LOW (ref 135–145)
TCO2: 15 mmol/L — ABNORMAL LOW (ref 22–32)

## 2023-01-28 LAB — RETICULOCYTES
Immature Retic Fract: 26.1 % — ABNORMAL HIGH (ref 2.3–15.9)
RBC.: 3.42 MIL/uL — ABNORMAL LOW (ref 3.87–5.11)
Retic Count, Absolute: 69.4 10*3/uL (ref 19.0–186.0)
Retic Ct Pct: 2 % (ref 0.4–3.1)

## 2023-01-28 LAB — TSH: TSH: 2.106 u[IU]/mL (ref 0.350–4.500)

## 2023-01-28 LAB — MAGNESIUM: Magnesium: 2.1 mg/dL (ref 1.7–2.4)

## 2023-01-28 LAB — IRON AND TIBC
Iron: <5 ug/dL — ABNORMAL LOW (ref 28–170)
TIBC: 161 ug/dL — ABNORMAL LOW (ref 250–450)

## 2023-01-28 LAB — PROTIME-INR
INR: 2.4 — ABNORMAL HIGH (ref 0.8–1.2)
Prothrombin Time: 26.2 seconds — ABNORMAL HIGH (ref 11.4–15.2)

## 2023-01-28 LAB — FOLATE: Folate: 6.1 ng/mL (ref >5.9)

## 2023-01-28 LAB — OSMOLALITY: Osmolality: 291 mOsm/kg (ref 275–295)

## 2023-01-28 LAB — ABO/RH: ABO/RH(D): A POS

## 2023-01-28 LAB — ETHANOL: Alcohol, Ethyl (B): <10 mg/dL (ref <10)

## 2023-01-28 LAB — GLUCOSE, CAPILLARY: Glucose-Capillary: 76 mg/dL (ref 70–99)

## 2023-01-28 MED ORDER — POLYETHYLENE GLYCOL 3350 17 G PO PACK: 17.0000 g | PACK | Freq: Every day | ORAL | Status: DC | PRN

## 2023-01-28 MED ORDER — SODIUM CHLORIDE 0.9 % IV BOLUS
500.0000 mL | Freq: Once | INTRAVENOUS | Status: AC
  Administered 2023-01-28: 500 mL via INTRAVENOUS

## 2023-01-28 MED ORDER — VANCOMYCIN HCL 750 MG/150ML IV SOLN: 750.0000 mg | INTRAVENOUS | Status: DC

## 2023-01-28 MED ORDER — METRONIDAZOLE 500 MG/100ML IV SOLN
500.0000 mg | Freq: Once | INTRAVENOUS | Status: AC
  Administered 2023-01-28: 500 mg via INTRAVENOUS
  Filled 2023-01-28: qty 100

## 2023-01-28 MED ORDER — SODIUM CHLORIDE 0.9 % IV BOLUS
1000.0000 mL | Freq: Once | INTRAVENOUS | Status: AC
  Administered 2023-01-28: 1000 mL via INTRAVENOUS

## 2023-01-28 MED ORDER — ALBUMIN HUMAN 5 % IV SOLN
12.5000 g | Freq: Once | INTRAVENOUS | Status: AC
  Administered 2023-01-28: 12.5 g via INTRAVENOUS
  Filled 2023-01-28: qty 250

## 2023-01-28 MED ORDER — CHLORHEXIDINE GLUCONATE CLOTH 2 % EX PADS
6.0000 | MEDICATED_PAD | Freq: Every day | CUTANEOUS | Status: DC
  Administered 2023-01-29 – 2023-01-31 (×3): 6 via TOPICAL

## 2023-01-28 MED ORDER — PANTOPRAZOLE SODIUM 40 MG PO TBEC: 40.0000 mg | DELAYED_RELEASE_TABLET | Freq: Every day | ORAL | Status: DC

## 2023-01-28 MED ORDER — VANCOMYCIN HCL 1250 MG/250ML IV SOLN
1250.0000 mg | Freq: Once | INTRAVENOUS | Status: AC
  Administered 2023-01-28: 1250 mg via INTRAVENOUS
  Filled 2023-01-28: qty 250

## 2023-01-28 MED ORDER — DOCUSATE SODIUM 100 MG PO CAPS: 100.0000 mg | ORAL_CAPSULE | Freq: Two times a day (BID) | ORAL | Status: DC | PRN

## 2023-01-28 MED ORDER — SODIUM CHLORIDE 0.9 % IV SOLN
2.0000 g | Freq: Once | INTRAVENOUS | Status: AC
  Administered 2023-01-28: 2 g via INTRAVENOUS
  Filled 2023-01-28: qty 12.5

## 2023-01-28 MED ORDER — DEXTROSE 50 % IV SOLN
25.0000 mL | Freq: Once | INTRAVENOUS | Status: AC
  Administered 2023-01-28: 25 mL via INTRAVENOUS
  Filled 2023-01-28: qty 50

## 2023-01-28 MED ORDER — SODIUM CHLORIDE 0.9 % IV SOLN
2.0000 g | INTRAVENOUS | Status: DC
  Administered 2023-01-29: 2 g via INTRAVENOUS
  Filled 2023-01-28: qty 12.5

## 2023-01-28 MED ORDER — SODIUM CHLORIDE 0.9 % IV SOLN
8.0000 mg | Freq: Four times a day (QID) | INTRAVENOUS | Status: DC | PRN
  Administered 2023-01-29: 8 mg via INTRAVENOUS
  Filled 2023-01-28: qty 4

## 2023-01-28 MED ORDER — VANCOMYCIN HCL IN DEXTROSE 1-5 GM/200ML-% IV SOLN
1000.0000 mg | Freq: Once | INTRAVENOUS | Status: DC
  Filled 2023-01-28: qty 200

## 2023-01-28 MED ORDER — NOREPINEPHRINE 4 MG/250ML-% IV SOLN
2.0000 ug/min | INTRAVENOUS | Status: DC
  Administered 2023-01-28: 2 ug/min via INTRAVENOUS
  Administered 2023-01-29 – 2023-01-30 (×3): 8 ug/min via INTRAVENOUS
  Filled 2023-01-28 (×5): qty 250

## 2023-01-28 MED ORDER — SODIUM CHLORIDE 0.9 % IV SOLN
250.0000 mL | INTRAVENOUS | Status: DC
  Administered 2023-01-28 – 2023-01-29 (×2): 250 mL via INTRAVENOUS

## 2023-01-28 NOTE — ED Provider Notes (Signed)
Critical care, contacted at 7:55 PM and they will come see the patient and help with the patient's care   Bethann Berkshire, MD 01/28/23 1954

## 2023-01-28 NOTE — ED Triage Notes (Signed)
Pt to the ed from home via ems with a CC of hypotension with sob. Pt has pitting edema in all extremities. Pt relays weakness over the last few days.   Pt denies cp loc or any other s/s at this time.

## 2023-01-28 NOTE — ED Provider Notes (Signed)
[place] EMERGENCY DEPARTMENT AT [facility] Provider Note   CSN: [id number] Arrival date & time: 01/28/23  1540     History {Add pertinent medical, surgical, social history, OB history to HPI:1} Chief Complaint  Patient presents with   Hypotension    Samantha Gentry is a 77 y.o. female.  Patient has a history of cirrhosis.  She comes in complaining of weakness for a number days and chills on Friday.   Weakness      Home Medications Prior to Admission medications   Medication Sig Start Date End Date Taking? Authorizing Provider  ibuprofen (ADVIL,MOTRIN) 200 MG tablet Take 400 mg by mouth every 6 (six) hours as needed.    [provider]  potassium chloride 20 MEQ TBCR Take 20 mEq by mouth daily. 10/07/17   Charlynne Pander, MD  spironolactone (ALDACTONE) 25 MG tablet Take 1 tablet (25 mg total) by mouth daily. 10/07/17   Charlynne Pander, MD      Allergies    Patient has no known allergies.    Review of Systems   Review of Systems  Neurological:  Positive for weakness.    Physical Exam Updated Vital Signs BP (!) 94/50   Pulse 96   Temp 98 F (36.7 C) (Oral)   Resp 20   Ht 5\' 6"  (1.676 m)   Wt 54.4 kg   SpO2 100%   BMI 19.37 kg/m  Physical Exam  ED Results / Procedures / Treatments   Labs (all labs ordered are listed, but only abnormal results are displayed) Labs Reviewed  CBC WITH DIFFERENTIAL/PLATELET - Abnormal; Notable for the following components:      Result Value   WBC 28.9 (*)    RBC 3.41 (*)    Hemoglobin 7.8 (*)    HCT 26.3 (*)    MCV 77.1 (*)    MCH 22.9 (*)    MCHC 29.7 (*)    RDW 21.0 (*)    Neutro Abs 27.2 (*)    All other components within normal limits  COMPREHENSIVE METABOLIC PANEL - Abnormal; Notable for the following components:   Sodium 129 (*)    Chloride 94 (*)    CO2 13 (*)    Glucose, Bld 50 (*)    BUN 34 (*)    Creatinine, Ser 2.29 (*)    Calcium 8.2 (*)    Total Protein 5.1 (*)    Albumin  1.8 (*)    AST 76 (*)    ALT 47 (*)    Total Bilirubin 3.5 (*)    GFR, Estimated 22 (*)    Anion gap 22 (*)    All other components within normal limits  CULTURE, BLOOD (ROUTINE X 2)  CULTURE, BLOOD (ROUTINE X 2)  MAGNESIUM  URINALYSIS, ROUTINE W REFLEX MICROSCOPIC  BRAIN NATRIURETIC PEPTIDE  PROTIME-INR  AMMONIA  LACTIC ACID, PLASMA  LACTIC ACID, PLASMA  PATHOLOGIST SMEAR REVIEW  POC OCCULT BLOOD, ED  POC OCCULT BLOOD, ED  I-STAT CHEM 8, ED  TYPE AND SCREEN  ABO/RH    EKG EKG Interpretation  Date/Time:  Monday January 28 2023 15:48:06 EDT Ventricular Rate:  97 PR Interval:  125 QRS Duration: 88 QT Interval:  350 QTC Calculation: 445 R Axis:   -12 Text Interpretation: Sinus rhythm Low voltage, precordial leads Consider anterior infarct Confirmed by Bethann Berkshire [phone]) on 01/28/2023 4:16:25 PM  Radiology DG Chest Portable 1 View  Result Date: 01/28/2023 CLINICAL DATA:  Hypotension EXAM: PORTABLE CHEST  1 VIEW COMPARISON:  10/07/2017 FINDINGS: Multiple overlying monitoring leads. Heart size appears mildly enlarged. Aortic atherosclerosis. At least 2 small nodular densities project within the right lung, partially obscured by overlying monitoring leads. Possible small left pleural effusion. No pneumothorax. Degenerative changes of the spine and shoulders. IMPRESSION: At least 2 small nodular densities project within the right lung, partially obscured by overlying monitoring leads. Further assessment with CT of the chest is recommended. Electronically Signed   By: Duanne Guess D.O.   On: 01/28/2023 16:29    Procedures Procedures  {Document cardiac monitor, telemetry assessment procedure when appropriate:1}  Medications Ordered in ED Medications  metroNIDAZOLE (FLAGYL) IVPB 500 mg (500 mg Intravenous New Bag/Given 01/28/23 1703)  sodium chloride 0.9 % bolus 1,000 mL (has no administration in time range)  vancomycin (VANCOREADY) IVPB 1250 mg/250 mL (has no administration  in time range)  sodium chloride 0.9 % bolus 500 mL (500 mLs Intravenous New Bag/Given 01/28/23 1646)  ceFEPIme (MAXIPIME) 2 g in sodium chloride 0.9 % 100 mL IVPB (2 g Intravenous New Bag/Given 01/28/23 1654)    ED Course/ Medical Decision Making/ A&P  CRITICAL CARE Performed by: Bethann Berkshire Total critical care time: 40 minutes Critical care time was exclusive of separately billable procedures and treating other patients. Critical care was necessary to treat or prevent imminent or life-threatening deterioration. Critical care was time spent personally by me on the following activities: development of treatment plan with patient and/or surrogate as well as nursing, discussions with consultants, evaluation of patient's response to treatment, examination of patient, obtaining history from patient or surrogate, ordering and performing treatments and interventions, ordering and review of laboratory studies, ordering and review of radiographic studies, pulse oximetry and re-evaluation of patient's condition.  {  Femoral stick done for bloos Click here for ABCD2, HEART and other calculatorsREFRESH Note before signing :1}                          Medical Decision Making Amount and/or Complexity of Data Reviewed Labs: ordered. Radiology: ordered.  Risk Prescription drug management. Decision regarding hospitalization.   Patient with sepsis from unknown source.  She is placed on broad based antibiotics and given normal saline bolus.  This improved her blood pressure {Document critical care time when appropriate:1} {Document review of labs and clinical decision tools ie heart score, Chads2Vasc2 etc:1}  {Document your independent review of radiology images, and any outside records:1} {Document your discussion with family members, caretakers, and with consultants:1} {Document social determinants of health affecting pt's care:1} {Document your decision making why or why not admission, treatments  were needed:1} Final Clinical Impression(s) / ED Diagnoses Final diagnoses:  None    Rx / DC Orders ED Discharge Orders     None

## 2023-01-28 NOTE — ED Notes (Signed)
x3 failed attempts by multiple staff at starting IV access to draw [facility]. Lab notified.  ABX started per EDP

## 2023-01-28 NOTE — H&P (Signed)
See consult note from 4/29

## 2023-01-28 NOTE — Progress Notes (Signed)
eLink Physician-Brief Progress Note Patient Name: Samantha Gentry DOB: March 04, 1946 MRN: [id number]   Date of Service  01/28/2023  HPI/Events of Note  Nausea, vomiting Qtc 445 on last EKG  eICU Interventions  Placed order for zofran PRN.  Will monitor response.         Bindi Klomp M DELA CRUZ 01/28/2023, 11:24 PM

## 2023-01-28 NOTE — ED Notes (Signed)
Family updated as to patient's status.

## 2023-01-28 NOTE — Progress Notes (Signed)
Responded to protocol consult for US guided IV. MD present in room for procedure.

## 2023-01-28 NOTE — ED Notes (Signed)
Patient transported to Ultrasound 

## 2023-01-28 NOTE — ED Notes (Signed)
Pt A&O x4; Breathing e/u, shallow breaths. Skin W/D/I, pale, and flaky. Inner corner of sclera are yellow bilaterally.  Pt has BLE  and reports new abdominal distension. Sinus arrhythmia noted on monitor. Pt reports having persistent nausea and "losing her bowels lately". Denies any noticeable dark stool.

## 2023-01-28 NOTE — ED Notes (Signed)
Paged CC for [facility]

## 2023-01-28 NOTE — Sepsis Progress Note (Signed)
Code Sepsis protocol being monitored by eLink. 

## 2023-01-28 NOTE — Consult Note (Addendum)
NAME:  Samantha Gentry, MRN:  [id number], DOB:  Apr 27, 1946, LOS: 0 ADMISSION DATE:  01/28/2023, CONSULTATION DATE: 4/29 REFERRING MD: Dr. Adela Glimpse, CHIEF COMPLAINT: Cirrhosis, lactic acidosis  History of Present Illness:  77 year old female with no significant past medical history as she has not seen a physician for many years and takes no medications at baseline.  She does report that she was a heavy drinker for many years, but took her last drink approximately 7 years ago.  She is an active smoker and smokes about 2 cigarettes a day which is down from about a half a pack a day at her peak.  She was in her usual state of health until about 1 month prior to presentation when she had several episodes of bowel incontinence, which eventually became bloody.  She presented to the gastroenterologist with this complaint on 4/17 where there was concern for hepatic cirrhosis.  Dr. Elnoria Howard sent blood work and requested an abdominal ultrasound, however, prior to these being done she presented to [facility] emergency department on 4/29 with complaints of progressive weakness, fatigue, and exceedingly poor exercise tolerance.  She reports she has had 1 episode of chills but no other infectious contacts or complaints.  Does complain of abdominal and lower extremity edema.  Bowel incontinence has decreased but is still present.  Hematochezia has resolved.  PCCM asked to evaluate for lactic acid greater than 9.   Pertinent  Medical History   has a past medical history of Arthritis.   Significant Hospital Events: Including procedures, antibiotic start and stop dates in addition to other pertinent events   4/29 admit with lactic acidosis, cirrhosis, possible sepsis  Interim History / Subjective:    Objective   Blood pressure 93/78, pulse 91, temperature (!) 97.5 F (36.4 C), resp. rate 17, height 5\' 6"  (1.676 m), weight 54.4 kg, SpO2 100 %.        Intake/Output Summary (Last 24 hours) at 01/28/2023 2030 Last  data filed at 01/28/2023 1913 Gross per 24 hour  Intake 1902.07 ml  Output --  Net 1902.07 ml   Filed Weights   01/28/23 1600  Weight: 54.4 kg    Examination: General: Elderly appearing female with normal body habitus in no acute distress HENT: Normocephalic, atraumatic, PERRL.  Scleral icterus present Lungs: Clear bilateral breath sounds, satting well on room air Cardiovascular: Regular rate and rhythm, no MRG Abdomen: Protuberant, soft, nontender Extremities: No acute deformity, moving all extremities with good strength, trace bilateral pedal edema.  Dry scaly skin on lower extremities. Neuro: Alert, oriented, nonfocal  Creatinine 2.29, BUN 34, sodium 129, potassium 4.8, AST 76, ALT 47, ammonia 23, BNP 176, lactic acid > 9 WBC 28.9, hemoglobin 7.8, hematocrit 26.3  US abdomen: Hepatic steatosis. No focal lesions identified. Moderate to large volume ascites. Nonspecific GB wall thickening.   Resolved Hospital Problem list     Assessment & Plan:   Hepatic cirrhosis: newly diagnosed: Former drinker, quit about 7 years ago. MELD 29. Ammonia wnl.  - Admit - Will plan to consult GI in the AM (Dr. Elnoria Howard) - Trend LFT, Bili, coags  Hypotension/Shock: etiology unclear. Sepsis vs intravascular depletion. BP's borderline, but with lactic 9 low threshold to treat more aggressively.  - Albumin infusing now - Will start some peripheral NE for MAP goal 65 - Echo  Lactic acidosis: etiology not entirely clear. No home medications. BP borderline low, but seems to be perfusing well. ? Related to dyspnea/work of breathing and not clearing due  to hepatic dysfunction - BP support - Trend LA - Therapeutic para once more tolerable from a BP perspective.   Leukocytosis: otherwise no clear evidence of infection. Of course there is SBP risk.  - Empiric cefepime - Diagnostic paracentesis for culture once the patient arrives in ICU - Blood cultures. - UA  Anemia: recent rectal bleeding, which  has subsided per patient.  ? GIB Coagulopathy: likely secondary to liver dz - anemia panel pending - Trend coags - GI has recommended scope recently. Planning to consult Dr. Elnoria Howard in the AM.  - PPI - Hemoccult  AKI Hyponatremia - trend BMP after volume  Hypoglycemia - CBG monitoring   Best Practice (right click and "Reselect all SmartList Selections" daily)   Diet/type: NPO w/ oral meds DVT prophylaxis: not indicated GI prophylaxis: PPI Lines: N/A Foley:  N/A Code Status:  full code Last date of multidisciplinary goals of care discussion [discussed goals of care in ED. "Needs to think about it" Full Code]  Labs   CBC: Recent Labs  Lab 01/28/23 1557 01/28/23 1741 01/28/23 1759  WBC 28.9*  --   --   NEUTROABS 27.2*  --   --   HGB 7.8* 7.1* 7.5*  HCT 26.3* 21.0* 22.0*  MCV 77.1*  --   --   PLT 232  --   --     Basic Metabolic Panel: Recent Labs  Lab 01/28/23 1557 01/28/23 1741 01/28/23 1759  NA 129* 129* 129*  K 4.8 4.8 4.9  CL 94* 100  --   CO2 13*  --   --   GLUCOSE 50* 44*  --   BUN 34* 34*  --   CREATININE 2.29* 2.30*  --   CALCIUM 8.2*  --   --   MG 2.1  --   --    GFR: Estimated Creatinine Clearance: 17.9 mL/min (A) (by C-G formula based on SCr of 2.3 mg/dL (H)). Recent Labs  Lab 01/28/23 1557 01/28/23 1733  WBC 28.9*  --   LATICACIDVEN  --  >9.0*    Liver Function Tests: Recent Labs  Lab 01/28/23 1557  AST 76*  ALT 47*  ALKPHOS 92  BILITOT 3.5*  PROT 5.1*  ALBUMIN 1.8*   No results for input(s): "LIPASE", "AMYLASE" in the last 168 hours. Recent Labs  Lab 01/28/23 1733  AMMONIA 23    ABG    Component Value Date/Time   HCO3 14.1 (L) 01/28/2023 1759   TCO2 15 (L) 01/28/2023 1759   ACIDBASEDEF 10.0 (H) 01/28/2023 1759   O2SAT 93 01/28/2023 1759     Coagulation Profile: No results for input(s): "INR", "PROTIME" in the last 168 hours.  Cardiac Enzymes: No results for input(s): "CKTOTAL", "CKMB", "CKMBINDEX", "TROPONINI"  in the last 168 hours.  HbA1C: No results found for: "HGBA1C"  CBG: Recent Labs  Lab 01/28/23 1805 01/28/23 2021  GLUCAP 85 73    Review of Systems:    Bolds are positive  Constitutional: weight loss, gain, night sweats, Fevers, chills, fatigue .  HEENT: headaches, Sore throat, sneezing, nasal congestion, post nasal drip, Difficulty swallowing, Tooth/dental problems, visual complaints visual changes, ear ache CV:  chest pain, radiates:,Orthopnea, PND, swelling in lower extremities, dizziness, palpitations, syncope.  GI  heartburn, indigestion, abdominal pain, nausea, vomiting, diarrhea, change in bowel habits, loss of appetite, bloody stools.  Resp: cough, productive: , hemoptysis, dyspnea, chest pain, pleuritic.  Skin: rash or itching or icterus GU: dysuria, change in color of urine, urgency or frequency. flank pain,  hematuria  MS: joint pain or swelling. decreased range of motion  Psych: change in mood or affect. depression or anxiety.  Neuro: difficulty with speech, weakness, numbness, ataxia    Past Medical History:  She,  has a past medical history of Arthritis.   Surgical History:  History reviewed. No pertinent surgical history.   Social History:   reports that she does not currently use alcohol. She reports that she does not use drugs.   Family History:  Her family history is not on file.   Allergies No Known Allergies   Home Medications  Prior to Admission medications   Not on File     Critical care time: 43 minutes     Joneen Roach, AGACNP-BC [facility]  See Amion for personal pager PCCM on call pager [phone] until 7pm. Please call Elink 7p-7a. [phone]  01/28/2023 9:39 PM

## 2023-01-28 NOTE — Progress Notes (Signed)
Pharmacy Antibiotic Note  Samantha Gentry is a 77 y.o. female admitted on 01/28/2023 with sepsis.  Pharmacy has been consulted for cefepime and vancomycin dosing.  Plan: - Start cefepime 2g IV q24hrs  - Give vancomycin IV 1250mg  x1 now, then vancomycin IV 750mg  q48hrs (eAUC 497 using Scr 2.30, TBW, and Vd 0.72) - Vancomycin levels as needed  - Monitor renal function, cultures, and overall clinical picture  - De-escalate antibiotics as able    Height: 5\' 6"  (167.6 cm) Weight: 54.4 kg (120 lb) IBW/kg (Calculated) : 59.3  Temp (24hrs), Avg:98 F (36.7 C), Min:98 F (36.7 C), Max:98 F (36.7 C)  Recent Labs  Lab 01/28/23 1557 01/28/23 1741  WBC 28.9*  --   CREATININE 2.29* 2.30*    Estimated Creatinine Clearance: 17.9 mL/min (A) (by C-G formula based on SCr of 2.3 mg/dL (H)).    No Known Allergies  Antimicrobials this admission: 4/29 Flagyl 500mg  x1  4/29 cefepime >>  4/29 vancomycin >>   Dose adjustments this admission: N/A  Microbiology results: 4/29 BCx: collected    Thank you for allowing pharmacy to be a part of this patient's care.  Cherylin Mylar, PharmD PGY1 Pharmacy Resident 4/29/20246:38 PM

## 2023-01-28 NOTE — ED Notes (Signed)
ED TO INPATIENT HANDOFF REPORT  ED Nurse Name and Phone #: Minerva Areola [phone]  S Name/Age/Gender Samantha Gentry 77 y.o. female Room/Bed: 009C/009C  Code Status   Code Status: Full Code  Home/SNF/Other Home Patient oriented to: self, place, time, and situation Is this baseline? Yes   Triage Complete: Triage complete  Chief Complaint SIRS (systemic inflammatory response syndrome) (HCC) [R65.10] Hepatic cirrhosis (HCC) [K74.60]  Triage Note Pt to the ed from home via ems with a CC of hypotension with sob. Pt has pitting edema in all extremities. Pt relays weakness over the last few days.   Pt denies cp loc or any other s/s at this time.    Allergies No Known Allergies  Level of Care/Admitting Diagnosis ED Disposition     ED Disposition  Admit   Condition  --   Comment  Hospital [facility]: [facility] [[id number]]  Level of Care: ICU [6]  May admit patient to [facility] or [facility] if equivalent level of care is available:: No  Covid Evaluation: Asymptomatic - no recent exposure (last 10 days) testing not required  Diagnosis: Hepatic cirrhosis [facility]) [200207]  Admitting Physician: Charlott Holler [[id number]]  Attending Physician: Charlott Holler [[id number]]  Certification:: I certify this patient will need inpatient services for at least 2 midnights  Estimated Length of Stay: 8          B Medical/Surgery History Past Medical History:  Diagnosis Date   Arthritis    History reviewed. No pertinent surgical history.   A IV Location/Drains/Wounds Patient Lines/Drains/Airways Status     Active Line/Drains/Airways     Name Placement date Placement time Site Days   Peripheral IV 01/28/23 20 G Right Antecubital 01/28/23  1615  Antecubital  less than 1   Peripheral IV 01/28/23 Left Antecubital 01/28/23  1656  Antecubital  less than 1            Intake/Output Last 24 hours  Intake/Output Summary (Last 24 hours) at 01/28/2023 2208 Last data filed at  01/28/2023 2056 Gross per 24 hour  Intake 2402.07 ml  Output --  Net 2402.07 ml    Labs/Imaging Results for orders placed or performed during the hospital encounter of 01/28/23 (from the past 48 hour(s))  CBC with Differential     Status: Abnormal   Collection Time: 01/28/23  3:57 PM  Result Value Ref Range   WBC 28.9 (H) 4.0 - 10.5 K/uL   RBC 3.41 (L) 3.87 - 5.11 MIL/uL   Hemoglobin 7.8 (L) 12.0 - 15.0 g/dL    Comment: Reticulocyte Hemoglobin testing may be clinically indicated, consider ordering this additional test [id number]    HCT 26.3 (L) 36.0 - 46.0 %   MCV 77.1 (L) 80.0 - 100.0 fL   MCH 22.9 (L) 26.0 - 34.0 pg   MCHC 29.7 (L) 30.0 - 36.0 g/dL   RDW 30.8 (H) 65.7 - 84.6 %   Platelets 232 150 - 400 K/uL   nRBC 0.0 0.0 - 0.2 %   Neutrophils Relative % 94 %   Neutro Abs 27.2 (H) 1.7 - 7.7 K/uL   Lymphocytes Relative 3 %   Lymphs Abs 0.9 0.7 - 4.0 K/uL   Monocytes Relative 3 %   Monocytes Absolute 0.9 0.1 - 1.0 K/uL   Eosinophils Relative 0 %   Eosinophils Absolute 0.0 0.0 - 0.5 K/uL   Basophils Relative 0 %   Basophils Absolute 0.0 0.0 - 0.1 K/uL   WBC Morphology See Note  Comment: MORPHOLOGY UNREMARKABLE   Smear Review See Note     Comment: MORPHOLOGY UNREMARKABLE   nRBC 0 0 /100 WBC   Abs Immature Granulocytes 0.00 0.00 - 0.07 K/uL   Acanthocytes PRESENT    Burr Cells PRESENT    Basophilic Stippling PRESENT    Ovalocytes PRESENT     Comment: Performed at [facility], [address], [place], [place] [address]  Comprehensive metabolic panel     Status: Abnormal   Collection Time: 01/28/23  3:57 PM  Result Value Ref Range   Sodium 129 (L) 135 - 145 mmol/L   Potassium 4.8 3.5 - 5.1 mmol/L   Chloride 94 (L) 98 - 111 mmol/L   CO2 13 (L) 22 - 32 mmol/L   Glucose, Bld 50 (L) 70 - 99 mg/dL    Comment: Glucose reference range applies only to samples taken after fasting for at least 8 hours.   BUN 34 (H) 8 - 23 mg/dL   Creatinine, Ser 6.04 (H) 0.44 - 1.00  mg/dL   Calcium 8.2 (L) 8.9 - 10.3 mg/dL   Total Protein 5.1 (L) 6.5 - 8.1 g/dL   Albumin 1.8 (L) 3.5 - 5.0 g/dL   AST 76 (H) 15 - 41 U/L   ALT 47 (H) 0 - 44 U/L    Comment: RESULT CONFIRMED BY MANUAL DILUTION   Alkaline Phosphatase 92 38 - 126 U/L   Total Bilirubin 3.5 (H) 0.3 - 1.2 mg/dL   GFR, Estimated 22 (L) >60 mL/min    Comment: (NOTE) Calculated using the CKD-EPI Creatinine Equation (2021)    Anion gap 22 (H) 5 - 15    Comment: ELECTROLYTES REPEATED TO VERIFY Performed at [facility], [address] [place], [place] [address]   Magnesium     Status: None   Collection Time: 01/28/23  3:57 PM  Result Value Ref Range   Magnesium 2.1 1.7 - 2.4 mg/dL    Comment: Performed at [facility], [address] [place], [place] [address]  Brain natriuretic peptide     Status: Abnormal   Collection Time: 01/28/23  3:57 PM  Result Value Ref Range   B Natriuretic Peptide 176.3 (H) 0.0 - 100.0 pg/mL    Comment: Performed at [facility], [address] [place], [place] [address]  Type and screen     Status: None   Collection Time: 01/28/23  3:57 PM  Result Value Ref Range   ABO/RH(D) A POS    Antibody Screen NEG    Sample Expiration      01/31/2023,2359 Performed at [facility], [address] [place], [place] [address]   Ammonia     Status: None   Collection Time: 01/28/23  5:33 PM  Result Value Ref Range   Ammonia 23 9 - 35 umol/L    Comment: Performed at [facility], [address], [place], [place] [address]  Lactic acid, plasma     Status: Abnormal   Collection Time: 01/28/23  5:33 PM  Result Value Ref Range   Lactic Acid, Venous >9.0 (HH) 0.5 - 1.9 mmol/L    Comment: CRITICAL RESULT CALLED TO, READ BACK BY AND VERIFIED WITH J. Guillermina City, RN AT 1855 04.29.24 D. Leonidas Romberg Performed at [facility], [address] [place], [place] [address]   ABO/Rh     Status: None   Collection Time: 01/28/23  5:33 PM  Result Value Ref Range  ABO/RH(D)      A POS Performed at [facility], [address], [place], [place] [address]   I-stat chem 8, ED (not at [facility] or [facility])     Status: Abnormal   Collection Time: 01/28/23  5:41 PM  Result Value Ref Range   Sodium 129 (L) 135 - 145 mmol/L   Potassium 4.8 3.5 - 5.1 mmol/L   Chloride 100 98 - 111 mmol/L   BUN 34 (H) 8 - 23 mg/dL   Creatinine, Ser 1.91 (H) 0.44 - 1.00 mg/dL   Glucose, Bld 44 (LL) 70 - 99 mg/dL    Comment: Glucose reference range applies only to samples taken after fasting for at least 8 hours.   Calcium, Ion 1.01 (L) 1.15 - 1.40 mmol/L   TCO2 15 (L) 22 - 32 mmol/L   Hemoglobin 7.1 (L) 12.0 - 15.0 g/dL   HCT 47.8 (L) 29.5 - 62.1 %   Comment NOTIFIED PHYSICIAN   I-Stat venous blood gas, ED     Status: Abnormal   Collection Time: 01/28/23  5:59 PM  Result Value Ref Range   pH, Ven 7.376 7.25 - 7.43   pCO2, Ven 24.1 (L) 44 - 60 mmHg   pO2, Ven 67 (H) 32 - 45 mmHg   Bicarbonate 14.1 (L) 20.0 - 28.0 mmol/L   TCO2 15 (L) 22 - 32 mmol/L   O2 Saturation 93 %   Acid-base deficit 10.0 (H) 0.0 - 2.0 mmol/L   Sodium 129 (L) 135 - 145 mmol/L   Potassium 4.9 3.5 - 5.1 mmol/L   Calcium, Ion 1.02 (L) 1.15 - 1.40 mmol/L   HCT 22.0 (L) 36.0 - 46.0 %   Hemoglobin 7.5 (L) 12.0 - 15.0 g/dL   Sample type VENOUS   CBG monitoring, ED     Status: None   Collection Time: 01/28/23  6:05 PM  Result Value Ref Range   Glucose-Capillary 85 70 - 99 mg/dL    Comment: Glucose reference range applies only to samples taken after fasting for at least 8 hours.  Protime-INR     Status: Abnormal   Collection Time: 01/28/23  7:50 PM  Result Value Ref Range   Prothrombin Time 26.2 (H) 11.4 - 15.2 seconds   INR 2.4 (H) 0.8 - 1.2    Comment: (NOTE) INR goal varies based on device and disease states. Performed at [facility] Lab, [address], [place], [place] [address]   Lactic acid, plasma     Status: Abnormal   Collection Time: 01/28/23  7:50 PM  Result Value Ref Range    Lactic Acid, Venous >9.0 (HH) 0.5 - 1.9 mmol/L    Comment: CRITICAL VALUE NOTED. VALUE IS CONSISTENT WITH PREVIOUSLY REPORTED/CALLED VALUE Performed at [facility], [address] [place], [place] [address]   Blood gas, venous (at [facility] and [facility])     Status: Abnormal   Collection Time: 01/28/23  7:50 PM  Result Value Ref Range   pH, Ven 7.31 7.25 - 7.43   pCO2, Ven 35 (L) 44 - 60 mmHg   pO2, Ven <31 (LL) 32 - 45 mmHg    Comment: CRITICAL RESULT CALLED TO, READ BACK BY AND VERIFIED WITHAltamese Dilling RN 04.29.2024 2033 MTIU    Bicarbonate 17.6 (L) 20.0 - 28.0 mmol/L   Acid-base deficit 7.8 (H) 0.0 - 2.0 mmol/L   O2 Saturation 21.5 %   Patient temperature 37.0     Comment: Performed at [facility], [address] [place], [place] [address]  CK     Status: None   Collection Time: 01/28/23  7:50 PM  Result Value Ref Range   Total CK 127 38 - 234 U/L    Comment: Performed at [facility], [address], [place], [place] [address]  Phosphorus     Status: Abnormal   Collection Time: 01/28/23  7:50 PM  Result Value Ref Range   Phosphorus 4.7 (H) 2.5 - 4.6 mg/dL    Comment: Performed at [facility], [address], [place], [place] [address]  Osmolality     Status: None   Collection Time: 01/28/23  7:50 PM  Result Value Ref Range   Osmolality 291 275 - 295 mOsm/kg    Comment: Performed at [facility], [address] [place], [place] [address]  TSH     Status: None   Collection Time: 01/28/23  7:50 PM  Result Value Ref Range   TSH 2.106 0.350 - 4.500 uIU/mL    Comment: Performed by a 3rd Generation assay with a functional sensitivity of <=0.01 uIU/mL. Performed at [facility], [address], [place], [place] [address]   Vitamin B12     Status: Abnormal   Collection Time: 01/28/23  7:50 PM  Result Value Ref Range   Vitamin B-12 1,479 (H) 180 - 914 pg/mL    Comment: (NOTE) This assay is not validated for testing neonatal or myeloproliferative  syndrome specimens for Vitamin B12 levels. Performed at [facility], [address] [place], [place] [address]   Folate     Status: None   Collection Time: 01/28/23  7:50 PM  Result Value Ref Range   Folate 6.1 >5.9 ng/mL    Comment: Performed at [facility], [address], [place], [place] [address]  Iron and TIBC     Status: Abnormal   Collection Time: 01/28/23  7:50 PM  Result Value Ref Range   Iron <5 (L) 28 - 170 ug/dL   TIBC 962 (L) 952 - 841 ug/dL   Saturation Ratios NOT CALCULATED 10.4 - 31.8 %   UIBC NOT CALCULATED ug/dL    Comment: Performed at [facility], [address] [place], [place] [address]  Ferritin     Status: None   Collection Time: 01/28/23  7:50 PM  Result Value Ref Range   Ferritin 61 11 - 307 ng/mL    Comment: Performed at [facility], [address] [place], [place] [address]  Reticulocytes     Status: Abnormal   Collection Time: 01/28/23  7:50 PM  Result Value Ref Range   Retic Ct Pct 2.0 0.4 - 3.1 %   RBC. 3.42 (L) 3.87 - 5.11 MIL/uL   Retic Count, Absolute 69.4 19.0 - 186.0 K/uL   Immature Retic Fract 26.1 (H) 2.3 - 15.9 %    Comment: Performed at [facility] Lab, [address], [place], [place] [address]  Ethanol     Status: None   Collection Time: 01/28/23  7:50 PM  Result Value Ref Range   Alcohol, Ethyl (B) <10 <10 mg/dL    Comment: (NOTE) Lowest detectable limit for serum alcohol is 10 mg/dL.  For medical purposes only. Performed at [facility], [address], [place], [place] [address]   CBG monitoring, ED     Status: None   Collection Time: 01/28/23  8:21 PM  Result Value Ref Range   Glucose-Capillary 73 70 - 99 mg/dL    Comment: Glucose reference range applies only to samples taken after fasting for at least 8 hours.   US Abdomen Complete  Result Date: 01/28/2023 CLINICAL DATA:  144615 Pain 144615 EXAM: ABDOMEN ULTRASOUND COMPLETE COMPARISON:  None Available. FINDINGS: Gallbladder: No  gallstones. Nonspecific gallbladder wall thickening visualized. No sonographic Murphy sign noted by sonographer. Common bile duct: Diameter: 9 mm Liver: Nodular hepatic contour. No focal lesion identified. Increased and coarsened parenchymal echogenicity. Portal vein is patent on color Doppler imaging with normal direction of blood flow towards the liver. IVC: Not visualized. Pancreas: Visualized portion unremarkable. Spleen: Size and appearance within normal limits. Right Kidney: Length: 10.3 cm. Echogenicity within normal limits. No mass or hydronephrosis visualized. Left Kidney: Length: 9.3 cm. Echogenicity within normal limits. No mass or hydronephrosis visualized. Abdominal aorta: Not visualized. Other findings: Moderate to large volume simple free fluid ascites. IMPRESSION: 1. Hepatic steatosis and cirrhosis. No focal liver lesions identified. Please note that liver protocol enhanced MR and CT are the most sensitive tests for the screening detection of hepatocellular carcinoma in the high risk setting of cirrhosis. 2. Moderate to large volume simple free fluid ascites. 3. Nonspecific gallbladder wall thickening which can be seen in the setting of chronic liver disease. 4. Abdominal aorta and IVC not visualized. Electronically Signed   By: Tish Frederickson M.D.   On: 01/28/2023 19:43   DG Chest Portable 1 View  Result Date: 01/28/2023 CLINICAL DATA:  Hypotension EXAM: PORTABLE CHEST 1 VIEW COMPARISON:  10/07/2017 FINDINGS: Multiple overlying monitoring leads. Heart size appears mildly enlarged. Aortic atherosclerosis. At least 2 small nodular densities project within the right lung, partially obscured by overlying monitoring leads. Possible small left pleural effusion. No pneumothorax. Degenerative changes of the spine and shoulders. IMPRESSION: At least 2 small nodular densities project within the right lung, partially obscured by overlying monitoring leads. Further assessment with CT of the chest is  recommended. Electronically Signed   By: Duanne Guess D.O.   On: 01/28/2023 16:29    Pending Labs Unresulted Labs (From admission, onward)     Start     Ordered   01/29/23 0500  Prealbumin  Tomorrow morning,   R        01/28/23 1856   01/29/23 0500  CBC  Tomorrow morning,   R        01/28/23 2137   01/29/23 0500  Comprehensive metabolic panel  Tomorrow morning,   R        01/28/23 2137   01/29/23 0500  Magnesium  Tomorrow morning,   R        01/28/23 2137   01/29/23 0500  Phosphorus  Tomorrow morning,   R        01/28/23 2137   01/29/23 0500  Protime-INR  Tomorrow morning,   R        01/28/23 2137   01/29/23 0500  Lactic acid, plasma  Tomorrow morning,   R        01/28/23 2137   01/29/23 0500  Procalcitonin  Tomorrow morning,   R       References:    Procalcitonin Lower Respiratory Tract Infection AND Sepsis Procalcitonin Algorithm   01/28/23 2137   01/28/23 2152  Hemoglobin A1c  Once,   R       Comments: To assess prior glycemic control    01/28/23 2151   01/28/23 1856  Osmolality, urine  Once,  URGENT        01/28/23 1856   01/28/23 1856  Creatinine, urine, random  Once,   URGENT        01/28/23 1856   01/28/23 1856  Sodium, urine, random  Once,   URGENT        01/28/23 1856   01/28/23 1616  Blood culture (routine x 2)  BLOOD CULTURE X 2,   R (with STAT occurrences)      01/28/23 1615   01/28/23 1559  Urinalysis, Routine w reflex microscopic -Urine, Clean Catch  Once,   URGENT       Question:  Specimen Source  Answer:  Urine, Clean Catch   01/28/23 1558   01/28/23 1557  Pathologist smear review  Once,   R        01/28/23 1557   Unscheduled  Occult blood card to lab, stool  As needed,   R      01/28/23 2203            Vitals/Pain Today's Vitals   01/28/23 1945 01/28/23 2027 01/28/23 2030 01/28/23 2050  BP: (!) 101/59 93/78 95/75  (!) 78/59  Pulse: 94 91 82 88  Resp: 20 17 17 15   Temp:  (!) 97.5 F (36.4 C)    TempSrc:      SpO2: 100% 100% 98% 100%   Weight:      Height:      PainSc:        Isolation Precautions No active isolations  Medications Medications  ceFEPIme (MAXIPIME) 2 g in sodium chloride 0.9 % 100 mL IVPB (has no administration in time range)  docusate sodium (COLACE) capsule 100 mg (has no administration in time range)  polyethylene glycol (MIRALAX / GLYCOLAX) packet 17 g (has no administration in time range)  pantoprazole (PROTONIX) EC tablet 40 mg (has no administration in time range)  0.9 %  sodium chloride infusion (has no administration in time range)  norepinephrine (LEVOPHED) 4mg  in (0.016 mg/mL) premix infusion (has no administration in time range)  sodium chloride 0.9 % bolus 500 mL (0 mLs Intravenous Stopped 01/28/23 1856)  ceFEPIme (MAXIPIME) 2 g in sodium chloride 0.9 % 100 mL IVPB (0 g Intravenous Stopped 01/28/23 1741)  metroNIDAZOLE (FLAGYL) IVPB 500 mg (0 mg Intravenous Stopped 01/28/23 1803)  sodium chloride 0.9 % bolus 1,000 mL (0 mLs Intravenous Stopped 01/28/23 1856)  vancomycin (VANCOREADY) IVPB 1250 mg/250 mL (0 mg Intravenous Stopped 01/28/23 1913)  dextrose 50 % solution 25 mL (25 mLs Intravenous Given 01/28/23 1748)  albumin human 5 % solution 12.5 g ( Intravenous Rate/Dose Change 01/28/23 2054)  sodium chloride 0.9 % bolus 500 mL (0 mLs Intravenous Stopped 01/28/23 2056)    Mobility walks     Focused Assessments Cardiac Assessment Handoff:  Cardiac Rhythm: Other (Comment) (SINUS ARRYTHMIA) Lab Results  Component Value Date   CKTOTAL 127 01/28/2023   No results found for: "DDIMER" Does the Patient currently have chest pain? No    R Recommendations: See Admitting Provider Note  Report given to:   Additional Notes: a and o x 4.

## 2023-01-29 ENCOUNTER — Encounter (HOSPITAL_COMMUNITY): Payer: Self-pay | Admitting: Internal Medicine

## 2023-01-29 ENCOUNTER — Inpatient Hospital Stay (HOSPITAL_COMMUNITY): Payer: Medicare Other

## 2023-01-29 DIAGNOSIS — A419 Sepsis, unspecified organism: Secondary | ICD-10-CM

## 2023-01-29 DIAGNOSIS — R0609 Other forms of dyspnea: Secondary | ICD-10-CM

## 2023-01-29 DIAGNOSIS — K7031 Alcoholic cirrhosis of liver with ascites: Secondary | ICD-10-CM

## 2023-01-29 DIAGNOSIS — R6510 Systemic inflammatory response syndrome (SIRS) of non-infectious origin without acute organ dysfunction: Secondary | ICD-10-CM

## 2023-01-29 LAB — GLUCOSE, CAPILLARY
Glucose-Capillary: 109 mg/dL — ABNORMAL HIGH (ref 70–99)
Glucose-Capillary: 121 mg/dL — ABNORMAL HIGH (ref 70–99)
Glucose-Capillary: 122 mg/dL — ABNORMAL HIGH (ref 70–99)
Glucose-Capillary: 135 mg/dL — ABNORMAL HIGH (ref 70–99)
Glucose-Capillary: 139 mg/dL — ABNORMAL HIGH (ref 70–99)
Glucose-Capillary: 55 mg/dL — ABNORMAL LOW (ref 70–99)
Glucose-Capillary: 56 mg/dL — ABNORMAL LOW (ref 70–99)
Glucose-Capillary: 90 mg/dL (ref 70–99)
Glucose-Capillary: 92 mg/dL (ref 70–99)
Glucose-Capillary: 96 mg/dL (ref 70–99)

## 2023-01-29 LAB — BODY FLUID CELL COUNT WITH DIFFERENTIAL
Lymphs, Fluid: 15 %
Neutrophil Count, Fluid: 85 % — ABNORMAL HIGH (ref 0–25)
Total Nucleated Cell Count, Fluid: 24900 cu mm — ABNORMAL HIGH (ref 0–1000)

## 2023-01-29 LAB — URINALYSIS, ROUTINE W REFLEX MICROSCOPIC
Bacteria, UA: NONE SEEN
Bilirubin Urine: NEGATIVE
Glucose, UA: NEGATIVE mg/dL
Ketones, ur: 5 mg/dL — AB
Leukocytes,Ua: NEGATIVE
Nitrite: NEGATIVE
Protein, ur: NEGATIVE mg/dL
Specific Gravity, Urine: 1.018 (ref 1.005–1.030)
pH: 5 (ref 5.0–8.0)

## 2023-01-29 LAB — ECHOCARDIOGRAM COMPLETE
AR max vel: 3.18 cm2
AV Area VTI: 3.73 cm2
AV Area mean vel: 2.96 cm2
AV Mean grad: 7 mmHg
AV Peak grad: 12.1 mmHg
Ao pk vel: 1.74 m/s
Area-P 1/2: 3.02 cm2
Est EF: >75
Height: 66 in
S' Lateral: 1.1 cm
Weight: 2063.51 oz

## 2023-01-29 LAB — COMPREHENSIVE METABOLIC PANEL
ALT: 36 U/L (ref 0–44)
AST: 64 U/L — ABNORMAL HIGH (ref 15–41)
Albumin: 1.7 g/dL — ABNORMAL LOW (ref 3.5–5.0)
Alkaline Phosphatase: 59 U/L (ref 38–126)
Anion gap: 20 — ABNORMAL HIGH (ref 5–15)
BUN: 39 mg/dL — ABNORMAL HIGH (ref 8–23)
CO2: 14 mmol/L — ABNORMAL LOW (ref 22–32)
Calcium: 8 mg/dL — ABNORMAL LOW (ref 8.9–10.3)
Chloride: 100 mmol/L (ref 98–111)
Creatinine, Ser: 2.24 mg/dL — ABNORMAL HIGH (ref 0.44–1.00)
GFR, Estimated: 22 mL/min — ABNORMAL LOW (ref >60)
Glucose, Bld: 109 mg/dL — ABNORMAL HIGH (ref 70–99)
Potassium: 4.5 mmol/L (ref 3.5–5.1)
Sodium: 134 mmol/L — ABNORMAL LOW (ref 135–145)
Total Bilirubin: 3.3 mg/dL — ABNORMAL HIGH (ref 0.3–1.2)
Total Protein: 4.3 g/dL — ABNORMAL LOW (ref 6.5–8.1)

## 2023-01-29 LAB — MAGNESIUM: Magnesium: 1.9 mg/dL (ref 1.7–2.4)

## 2023-01-29 LAB — ALBUMIN, PLEURAL OR PERITONEAL FLUID: Albumin, Fluid: <1.5 g/dL

## 2023-01-29 LAB — LACTIC ACID, PLASMA
Lactic Acid, Venous: >9 mmol/L (ref 0.5–1.9)
Lactic Acid, Venous: >9 mmol/L (ref 0.5–1.9)

## 2023-01-29 LAB — CBC
HCT: 25.8 % — ABNORMAL LOW (ref 36.0–46.0)
Hemoglobin: 7.4 g/dL — ABNORMAL LOW (ref 12.0–15.0)
MCH: 22.5 pg — ABNORMAL LOW (ref 26.0–34.0)
MCHC: 28.7 g/dL — ABNORMAL LOW (ref 30.0–36.0)
MCV: 78.4 fL — ABNORMAL LOW (ref 80.0–100.0)
Platelets: 162 10*3/uL (ref 150–400)
RBC: 3.29 MIL/uL — ABNORMAL LOW (ref 3.87–5.11)
RDW: 21 % — ABNORMAL HIGH (ref 11.5–15.5)
WBC: 18.1 10*3/uL — ABNORMAL HIGH (ref 4.0–10.5)
nRBC: 0 % (ref 0.0–0.2)

## 2023-01-29 LAB — FERRITIN: Ferritin: 61 ng/mL (ref 11–307)

## 2023-01-29 LAB — PROTIME-INR
INR: 2.8 — ABNORMAL HIGH (ref 0.8–1.2)
Prothrombin Time: 28.9 seconds — ABNORMAL HIGH (ref 11.4–15.2)

## 2023-01-29 LAB — BODY FLUID CULTURE W GRAM STAIN

## 2023-01-29 LAB — MRSA NEXT GEN BY PCR, NASAL: MRSA by PCR Next Gen: NOT DETECTED

## 2023-01-29 LAB — GLUCOSE, PLEURAL OR PERITONEAL FLUID: Glucose, Fluid: <20 mg/dL

## 2023-01-29 LAB — PHOSPHORUS: Phosphorus: 5 mg/dL — ABNORMAL HIGH (ref 2.5–4.6)

## 2023-01-29 LAB — PROCALCITONIN: Procalcitonin: 11.53 ng/mL

## 2023-01-29 LAB — PROTEIN, PLEURAL OR PERITONEAL FLUID: Total protein, fluid: <3 g/dL

## 2023-01-29 LAB — PREALBUMIN: Prealbumin: <5 mg/dL — ABNORMAL LOW (ref 18–38)

## 2023-01-29 LAB — SODIUM, URINE, RANDOM: Sodium, Ur: <10 mmol/L

## 2023-01-29 LAB — OSMOLALITY, URINE: Osmolality, Ur: 420 mOsm/kg (ref 300–900)

## 2023-01-29 LAB — LACTATE DEHYDROGENASE, PLEURAL OR PERITONEAL FLUID: LD, Fluid: 646 U/L — ABNORMAL HIGH (ref 3–23)

## 2023-01-29 LAB — CREATININE, URINE, RANDOM: Creatinine, Urine: 167 mg/dL

## 2023-01-29 LAB — PROTEIN, TOTAL: Total Protein: 4.4 g/dL — ABNORMAL LOW (ref 6.5–8.1)

## 2023-01-29 MED ORDER — LACTATED RINGERS IV BOLUS
1000.0000 mL | Freq: Once | INTRAVENOUS | Status: AC
  Administered 2023-01-29: 1000 mL via INTRAVENOUS

## 2023-01-29 MED ORDER — DEXTROSE IN LACTATED RINGERS 5 % IV SOLN: INTRAVENOUS | Status: DC

## 2023-01-29 MED ORDER — DEXTROSE 50 % IV SOLN
1.0000 | Freq: Once | INTRAVENOUS | Status: AC
  Administered 2023-01-29: 50 mL via INTRAVENOUS
  Filled 2023-01-29: qty 50

## 2023-01-29 MED ORDER — SODIUM BICARBONATE 8.4 % IV SOLN
INTRAVENOUS | Status: DC
  Filled 2023-01-29 (×7): qty 1000

## 2023-01-29 MED ORDER — SODIUM CHLORIDE 0.9 % IV SOLN
200.0000 mg | Freq: Once | INTRAVENOUS | Status: AC
  Administered 2023-01-29: 200 mg via INTRAVENOUS
  Filled 2023-01-29: qty 10

## 2023-01-29 MED ORDER — LACTATED RINGERS IV BOLUS: 1000.0000 mL | Freq: Once | INTRAVENOUS | Status: DC

## 2023-01-29 MED ORDER — ALBUMIN HUMAN 25 % IV SOLN
12.5000 g | Freq: Once | INTRAVENOUS | Status: AC
  Administered 2023-01-29: 12.5 g via INTRAVENOUS
  Filled 2023-01-29: qty 50

## 2023-01-29 MED ORDER — PANTOPRAZOLE SODIUM 40 MG IV SOLR
40.0000 mg | INTRAVENOUS | Status: DC
  Administered 2023-01-29: 40 mg via INTRAVENOUS
  Filled 2023-01-29: qty 10

## 2023-01-29 MED ORDER — SODIUM CHLORIDE 0.9 % IV SOLN: 250.0000 mL | INTRAVENOUS | Status: DC

## 2023-01-29 MED ORDER — ALBUMIN HUMAN 5 % IV SOLN
12.5000 g | Freq: Once | INTRAVENOUS | Status: AC
  Administered 2023-01-29: 12.5 g via INTRAVENOUS
  Filled 2023-01-29: qty 250

## 2023-01-29 MED ORDER — ORAL CARE MOUTH RINSE: 15.0000 mL | OROMUCOSAL | Status: DC | PRN

## 2023-01-29 NOTE — Progress Notes (Signed)
Hypoglycemic Event  CBG: 56  Treatment: 4 oz juice/soda  Symptoms: None  Follow-up CBG: Time:0102 CBG Result: 55   Possible Reasons for Event: Vomiting and Inadequate meal intake  Comments/MD notified: Durel Salts, MD Provider at bedside for procedure. Informed of BGL. Instructed to have pt try juice. Pt noted to have NPO order. Provider instructs to try juice and would order dextrose as well.     Elijah Birk, RN

## 2023-01-29 NOTE — Progress Notes (Addendum)
NAME:  Mikaila Grunert, MRN:  [id number], DOB:  [birth year], LOS: 1 ADMISSION DATE:  01/28/2023, CONSULTATION DATE: 4/29 REFERRING MD: Dr. Adela Glimpse, CHIEF COMPLAINT: Cirrhosis, lactic acidosis  History of Present Illness:  77 year old female with no significant past medical history as she has not seen a physician for many years and takes no medications at baseline.  She does report that she was a heavy drinker for many years, but took her last drink approximately 7 years ago.  She is an active smoker and smokes about 2 cigarettes a day which is down from about a half a pack a day at her peak.  She was in her usual state of health until about 1 month prior to presentation when she had several episodes of bowel incontinence, which eventually became bloody.  She presented to the gastroenterologist with this complaint on 4/17 where there was concern for hepatic cirrhosis.  Dr. Elnoria Howard sent blood work and requested an abdominal ultrasound, however, prior to these being done she presented to [facility] emergency department on 4/29 with complaints of progressive weakness, fatigue, and exceedingly poor exercise tolerance.  She reports she has had 1 episode of chills but no other infectious contacts or complaints.  Does complain of abdominal and lower extremity edema.  Bowel incontinence has decreased but is still present.  Hematochezia has resolved.  PCCM asked to evaluate for lactic acid greater than 9.   Pertinent  Medical History   has a past medical history of Arthritis.   Significant Hospital Events: Including procedures, antibiotic start and stop dates in addition to other pertinent events   4/29 admit with lactic acidosis, cirrhosis, possible sepsis; started on levo 4/30 paracentesis performed w/ 60 cc of cloudy fluid drained  Interim History / Subjective:  60 cc's of cloudy fluid drained w/ paracentesis Alert Some nausea without vomiting On 8 mcg levo  Objective   Blood pressure 99/61, pulse  92, temperature 97.8 F (36.6 C), temperature source Oral, resp. rate (!) 21, height 5\' 6"  (1.676 m), weight 58.5 kg, SpO2 99 %.        Intake/Output Summary (Last 24 hours) at 01/29/2023 0758 Last data filed at 01/29/2023 0700 Gross per 24 hour  Intake 2870.59 ml  Output --  Net 2870.59 ml    Filed Weights   01/28/23 1600 01/28/23 2330 01/29/23 0416  Weight: 54.4 kg 58.4 kg 58.5 kg    Examination: General:  ill appearing female in NAD HEENT: MM pink/moist Neuro: Aox3; MAE CV: s1s2, RRR, no m/r/g PULM:  dim clear BS bilaterally; RA GI: mildly distended w/ no pain on palpation, soft, bsx4 active  Extremities: warm/dry, no edema  Skin: no rashes or lesions appreciated  US abdomen: Hepatic steatosis. No focal lesions identified. Moderate to large volume ascites. Nonspecific GB wall thickening.   Resolved Hospital Problem list     Assessment & Plan:   Hepatic cirrhosis: newly diagnosed: Former drinker, quit about 7 years ago. MELD 29. Ammonia wnl.  Possible SBP P: -GI consulted; appreciate recs; followed by Dr. Elnoria Howard outpt -trend cmp and coags -f/u cultures from paracentesis -continue empiric abx; consider narrowing to rocephin  Hypotension/Shock: etiology unclear. Sepsis vs intravascular depletion. BP's borderline, but with lactic 9 low threshold to treat more aggressively.  Lactic acidosis: etiology not entirely clear. No home medications. BP borderline low, but seems to be perfusing well. ? Related to dyspnea/work of breathing and not clearing due to hepatic dysfunction P: -cont to wean levo for map goal >65 -  cont cefepime and consider narrowing to rocephin; f/u cultures -will give albumin this am -trend LA; consider more iv fluids -echo pending  Anemia: recent rectal bleeding, which has subsided per patient.  ? GIB Coagulopathy: likely secondary to liver dz P: -trend cbc and coags -GI consulted; appreciate recs -was followed by Dr. Elnoria Howard outpt; recommended  scope -PPI -Hemoccult pending  AKI Hyponatremia P: -Trend CMP / urinary output -Replace electrolytes as indicated -Avoid nephrotoxic agents, ensure adequate renal perfusion  Hypoglycemia P: -cont dextrose -CBG monitoring  Tobacco abuse 2 small right lung nodular densities P: -smoking cessation counseling -incidental finding; hard to asses w/ CXR; recommended CT chest   Best Practice (right click and "Reselect all SmartList Selections" daily)   Diet/type: NPO w/ oral meds DVT prophylaxis: SCD GI prophylaxis: PPI Lines: N/A Foley:  N/A Code Status:  full code Last date of multidisciplinary goals of care discussion [4/30 updated pt at bedside]  Labs   CBC: Recent Labs  Lab 01/28/23 1557 01/28/23 1741 01/28/23 1759 01/29/23 0447  WBC 28.9*  --   --  18.1*  NEUTROABS 27.2*  --   --   --   HGB 7.8* 7.1* 7.5* 7.4*  HCT 26.3* 21.0* 22.0* 25.8*  MCV 77.1*  --   --  78.4*  PLT 232  --   --  162     Basic Metabolic Panel: Recent Labs  Lab 01/28/23 1557 01/28/23 1741 01/28/23 1759 01/28/23 1950  NA 129* 129* 129*  --   K 4.8 4.8 4.9  --   CL 94* 100  --   --   CO2 13*  --   --   --   GLUCOSE 50* 44*  --   --   BUN 34* 34*  --   --   CREATININE 2.29* 2.30*  --   --   CALCIUM 8.2*  --   --   --   MG 2.1  --   --   --   PHOS  --   --   --  4.7*    GFR: Estimated Creatinine Clearance: 19.2 mL/min (A) (by C-G formula based on SCr of 2.3 mg/dL (H)). Recent Labs  Lab 01/28/23 1557 01/28/23 1733 01/28/23 1950 01/29/23 0447  WBC 28.9*  --   --  18.1*  LATICACIDVEN  --  >9.0* >9.0* >9.0*     Liver Function Tests: Recent Labs  Lab 01/28/23 1557  AST 76*  ALT 47*  ALKPHOS 92  BILITOT 3.5*  PROT 5.1*  ALBUMIN 1.8*    No results for input(s): "LIPASE", "AMYLASE" in the last 168 hours. Recent Labs  Lab 01/28/23 1733  AMMONIA 23     ABG    Component Value Date/Time   HCO3 17.6 (L) 01/28/2023 1950   TCO2 15 (L) 01/28/2023 1759    ACIDBASEDEF 7.8 (H) 01/28/2023 1950   O2SAT 21.5 01/28/2023 1950     Coagulation Profile: Recent Labs  Lab 01/28/23 1950 01/29/23 0447  INR 2.4* 2.8*    Cardiac Enzymes: Recent Labs  Lab 01/28/23 1950  CKTOTAL 127    HbA1C: Hgb A1c MFr Bld  Date/Time Value Ref Range Status  01/28/2023 07:50 PM 4.4 (L) 4.8 - 5.6 % Final    Comment:    (NOTE) Pre diabetes:          5.7%-6.4%  Diabetes:              >6.4%  Glycemic control for   <7.0% adults with diabetes  CBG: Recent Labs  Lab 01/29/23 0102 01/29/23 0141 01/29/23 0253 01/29/23 0411 01/29/23 0732  GLUCAP 55* 122* 139* 121* 96     Review of Systems:    Bolds are positive  Constitutional: weight loss, gain, night sweats, Fevers, chills, fatigue .  HEENT: headaches, Sore throat, sneezing, nasal congestion, post nasal drip, Difficulty swallowing, Tooth/dental problems, visual complaints visual changes, ear ache CV:  chest pain, radiates:,Orthopnea, PND, swelling in lower extremities, dizziness, palpitations, syncope.  GI  heartburn, indigestion, abdominal pain, nausea, vomiting, diarrhea, change in bowel habits, loss of appetite, bloody stools.  Resp: cough, productive: , hemoptysis, dyspnea, chest pain, pleuritic.  Skin: rash or itching or icterus GU: dysuria, change in color of urine, urgency or frequency. flank pain, hematuria  MS: joint pain or swelling. decreased range of motion  Psych: change in mood or affect. depression or anxiety.  Neuro: difficulty with speech, weakness, numbness, ataxia    Past Medical History:  She,  has a past medical history of Arthritis.   Surgical History:  History reviewed. No pertinent surgical history.   Social History:   reports that she has been smoking cigarettes. She does not have any smokeless tobacco history on file. She reports that she does not currently use alcohol. She reports that she does not use drugs.   Family History:  Her family history is not on  file.   Allergies No Known Allergies   Home Medications  Prior to Admission medications   Not on File     Critical care time: 35 minutes     JD Anselm Lis  Pulmonary & Critical Care 01/29/2023, 8:30 AM  Please see [email or web address] for pager details.  From 7A-7P if no response, please call [phone]. After hours, please call ELink [phone].

## 2023-01-29 NOTE — Progress Notes (Signed)
  Echocardiogram 2D Echocardiogram has been performed.  Milda Smart 01/29/2023, 3:59 PM

## 2023-01-29 NOTE — Care Management (Signed)
  Transition of Care [facility]) Screening Note   Patient Details  Name: Samantha Gentry Date of Birth: [birth year]   Transition of Care [facility]) CM/SW Contact:    Gala Lewandowsky, RN Phone Number: 01/29/2023, 4:25 PM    Transition of Care Department [facility]) has reviewed the patient. Patient presented for shortness of breath, fatigue, and hypotension. Patient initiated on IV Maxipime. We will continue to monitor patient advancement through interdisciplinary progression rounds. If new patient transition needs arise, please place a TOC consult.

## 2023-01-29 NOTE — Consult Note (Signed)
Reason for Consult: Ascites, ETOH cirrhosis, SBP Referring Physician: Critical Care  Samantha Gentry HPI: This is a 77 year old female with a PMH of ETOH cirrhosis, history of ETOH abuse, abstinent for 7+ years, and arthritis admitted to the hospital for fatigue and weakness.  She was recently evaluated in the office for her complaints of fecal incontinence, hematochezia, and decompensated cirrhosis.  She initially managed in the office for decompensated ETOH cirrhosis 10/2017.  At that time she was able to be managed on an outpatient basis.  Her main problem was the ascites and she was started on Step I diuretics and maintained on a 2 gram sodium diet.  She was very compliant with the medications and her dietary habits.  ETOH was not a problem for her and her ascites resolved as well as the lower extremity edema over time.  Her clinical status improved to a point where she was consuming too many calories and she gained weight.  With the pandemic further work up was not to be performed with a screening EGD.  From 07/25/2020 until 01/16/2023 she did not follow up.  In October 2021 she was off of all diuretics and doing well clinically.  With the most recent office visit she exhibited decompensated cirrhosis and she was not feeling well.  Arrangements were being made to assess her ascites with an abdominal ultrasound and endoscopic evaluation with an EGD/colonoscopy.  Blood work in the office showed that she was anemic at 7.8 g/dL, which was a marked decline from 14 g/dL on 16/1096.  Her liver enzymes were mildly elevated and her TB was at 1.9.  With this admission her creatinine increased to 2.24, which was an increase from 1.05 in the office.    Past Medical History:  Diagnosis Date   Arthritis     History reviewed. No pertinent surgical history.  History reviewed. No pertinent family history.  Social History:  reports that she has been smoking cigarettes. She does not have any smokeless tobacco  history on file. She reports that she does not currently use alcohol. She reports that she does not use drugs.  Allergies: No Known Allergies  Medications: Scheduled:  Chlorhexidine Gluconate Cloth  6 each Topical Daily   pantoprazole (PROTONIX) IV  40 mg Intravenous Q24H   Continuous:  sodium chloride Stopped (01/29/23 0928)   sodium chloride     albumin human     ceFEPime (MAXIPIME) IV 2 g (01/29/23 1726)   dextrose 5% lactated ringers 50 mL/hr at 01/29/23 1600   iron sucrose     norepinephrine (LEVOPHED) Adult infusion 8 mcg/min (01/29/23 1600)   ondansetron (ZOFRAN) IV Stopped (01/29/23 0044)    Results for orders placed or performed during the hospital encounter of 01/28/23 (from the past 24 hour(s))  Ammonia     Status: None   Collection Time: 01/28/23  5:33 PM  Result Value Ref Range   Ammonia 23 9 - 35 umol/L  Blood culture (routine x 2)     Status: None (Preliminary result)   Collection Time: 01/28/23  5:33 PM   Specimen: BLOOD  Result Value Ref Range   Specimen Description BLOOD FEMORAL ARTERY    Special Requests      BOTTLES DRAWN AEROBIC AND ANAEROBIC Blood Culture results may not be optimal due to an inadequate volume of blood received in culture bottles   Culture      NO GROWTH < 12 HOURS Performed at [facility], [address],  [place], [place] [address]    Report Status PENDING   Lactic acid, plasma     Status: Abnormal   Collection Time: 01/28/23  5:33 PM  Result Value Ref Range   Lactic Acid, Venous >9.0 (HH) 0.5 - 1.9 mmol/L  ABO/Rh     Status: None   Collection Time: 01/28/23  5:33 PM  Result Value Ref Range   ABO/RH(D)      A POS Performed at [facility], [address], [place], [place] [address]   I-stat chem 8, ED (not at [facility], [facility] or [facility])     Status: Abnormal   Collection Time: 01/28/23  5:41 PM  Result Value Ref Range   Sodium 129 (L) 135 - 145 mmol/L   Potassium 4.8 3.5 - 5.1 mmol/L   Chloride 100 98 - 111 mmol/L   BUN 34  (H) 8 - 23 mg/dL   Creatinine, Ser 0.98 (H) 0.44 - 1.00 mg/dL   Glucose, Bld 44 (LL) 70 - 99 mg/dL   Calcium, Ion 1.19 (L) 1.15 - 1.40 mmol/L   TCO2 15 (L) 22 - 32 mmol/L   Hemoglobin 7.1 (L) 12.0 - 15.0 g/dL   HCT 14.7 (L) 82.9 - 56.2 %   Comment NOTIFIED PHYSICIAN   I-Stat venous blood gas, ED     Status: Abnormal   Collection Time: 01/28/23  5:59 PM  Result Value Ref Range   pH, Ven 7.376 7.25 - 7.43   pCO2, Ven 24.1 (L) 44 - 60 mmHg   pO2, Ven 67 (H) 32 - 45 mmHg   Bicarbonate 14.1 (L) 20.0 - 28.0 mmol/L   TCO2 15 (L) 22 - 32 mmol/L   O2 Saturation 93 %   Acid-base deficit 10.0 (H) 0.0 - 2.0 mmol/L   Sodium 129 (L) 135 - 145 mmol/L   Potassium 4.9 3.5 - 5.1 mmol/L   Calcium, Ion 1.02 (L) 1.15 - 1.40 mmol/L   HCT 22.0 (L) 36.0 - 46.0 %   Hemoglobin 7.5 (L) 12.0 - 15.0 g/dL   Sample type VENOUS   CBG monitoring, ED     Status: None   Collection Time: 01/28/23  6:05 PM  Result Value Ref Range   Glucose-Capillary 85 70 - 99 mg/dL  Protime-INR     Status: Abnormal   Collection Time: 01/28/23  7:50 PM  Result Value Ref Range   Prothrombin Time 26.2 (H) 11.4 - 15.2 seconds   INR 2.4 (H) 0.8 - 1.2  Blood culture (routine x 2)     Status: None (Preliminary result)   Collection Time: 01/28/23  7:50 PM   Specimen: BLOOD  Result Value Ref Range   Specimen Description BLOOD LEFT ANTECUBITAL    Special Requests      BOTTLES DRAWN AEROBIC AND ANAEROBIC Blood Culture adequate volume   Culture      NO GROWTH < 12 HOURS Performed at [facility], [address], [place], [place] [address]    Report Status PENDING   Lactic acid, plasma     Status: Abnormal   Collection Time: 01/28/23  7:50 PM  Result Value Ref Range   Lactic Acid, Venous >9.0 (HH) 0.5 - 1.9 mmol/L  Blood gas, venous (at [facility] and [facility])     Status: Abnormal   Collection Time: 01/28/23  7:50 PM  Result Value Ref Range   pH, Ven 7.31 7.25 - 7.43   pCO2, Ven 35 (L) 44 - 60 mmHg   pO2, Ven <31 (LL)  32 - 45 mmHg    Bicarbonate 17.6 (L) 20.0 - 28.0 mmol/L   Acid-base deficit 7.8 (H) 0.0 - 2.0 mmol/L   O2 Saturation 21.5 %   Patient temperature 37.0   CK     Status: None   Collection Time: 01/28/23  7:50 PM  Result Value Ref Range   Total CK 127 38 - 234 U/L  Phosphorus     Status: Abnormal   Collection Time: 01/28/23  7:50 PM  Result Value Ref Range   Phosphorus 4.7 (H) 2.5 - 4.6 mg/dL  Osmolality     Status: None   Collection Time: 01/28/23  7:50 PM  Result Value Ref Range   Osmolality 291 275 - 295 mOsm/kg  TSH     Status: None   Collection Time: 01/28/23  7:50 PM  Result Value Ref Range   TSH 2.106 0.350 - 4.500 uIU/mL  Vitamin B12     Status: Abnormal   Collection Time: 01/28/23  7:50 PM  Result Value Ref Range   Vitamin B-12 1,479 (H) 180 - 914 pg/mL  Folate     Status: None   Collection Time: 01/28/23  7:50 PM  Result Value Ref Range   Folate 6.1 >5.9 ng/mL  Iron and TIBC     Status: Abnormal   Collection Time: 01/28/23  7:50 PM  Result Value Ref Range   Iron <5 (L) 28 - 170 ug/dL   TIBC 161 (L) 096 - 045 ug/dL   Saturation Ratios NOT CALCULATED 10.4 - 31.8 %   UIBC NOT CALCULATED ug/dL  Ferritin     Status: None   Collection Time: 01/28/23  7:50 PM  Result Value Ref Range   Ferritin 61 11 - 307 ng/mL  Reticulocytes     Status: Abnormal   Collection Time: 01/28/23  7:50 PM  Result Value Ref Range   Retic Ct Pct 2.0 0.4 - 3.1 %   RBC. 3.42 (L) 3.87 - 5.11 MIL/uL   Retic Count, Absolute 69.4 19.0 - 186.0 K/uL   Immature Retic Fract 26.1 (H) 2.3 - 15.9 %  Ethanol     Status: None   Collection Time: 01/28/23  7:50 PM  Result Value Ref Range   Alcohol, Ethyl (B) <10 <10 mg/dL  Hemoglobin W0J     Status: Abnormal   Collection Time: 01/28/23  7:50 PM  Result Value Ref Range   Hgb A1c MFr Bld 4.4 (L) 4.8 - 5.6 %   Mean Plasma Glucose 79.58 mg/dL  CBG monitoring, ED     Status: None   Collection Time: 01/28/23  8:21 PM  Result Value Ref Range   Glucose-Capillary 73 70 - 99  mg/dL  MRSA Next Gen by PCR, Nasal     Status: None   Collection Time: 01/28/23 11:04 PM   Specimen: Nasal Mucosa; Nasal Swab  Result Value Ref Range   MRSA by PCR Next Gen NOT DETECTED NOT DETECTED  Glucose, capillary     Status: None   Collection Time: 01/28/23 11:12 PM  Result Value Ref Range   Glucose-Capillary 76 70 - 99 mg/dL  Glucose, capillary     Status: Abnormal   Collection Time: 01/29/23 12:06 AM  Result Value Ref Range   Glucose-Capillary 56 (L) 70 - 99 mg/dL  Albumin, pleural or peritoneal fluid      Status: None   Collection Time: 01/29/23 12:31 AM  Result Value Ref Range   Albumin, Fluid <1.5 g/dL   Fluid Type-FALB PARACENTESIS  Body fluid cell count with differential     Status: Abnormal   Collection Time: 01/29/23 12:31 AM  Result Value Ref Range   Fluid Type-FCT PARACENTESIS    Color, Fluid STRAW    Appearance, Fluid CLOUDY (A) CLEAR   Total Nucleated Cell Count, Fluid 24,900 (H) 0 - 1,000 cu mm   Neutrophil Count, Fluid 85 (H) 0 - 25 %   Lymphs, Fluid 15 %   Other Cells, Fluid OTHER CELLS IDENTIFIED AS MESOTHELIAL CELLS %  Lactate dehydrogenase (pleural or peritoneal fluid)     Status: Abnormal   Collection Time: 01/29/23 12:31 AM  Result Value Ref Range   LD, Fluid 646 (H) 3 - 23 U/L   Fluid Type-FLDH PARACENTESIS   Glucose, pleural or peritoneal fluid     Status: None   Collection Time: 01/29/23 12:31 AM  Result Value Ref Range   Glucose, Fluid <20 mg/dL   Fluid Type-FGLU PARACENTESIS   Protein, pleural or peritoneal fluid     Status: None   Collection Time: 01/29/23 12:31 AM  Result Value Ref Range   Total protein, fluid <3.0 g/dL   Fluid Type-FTP PARACENTESIS   Body fluid culture w Gram Stain     Status: None (Preliminary result)   Collection Time: 01/29/23 12:31 AM   Specimen: Paracentesis; Pleural Fluid  Result Value Ref Range   Specimen Description PARACENTESIS    Special Requests NONE    Gram Stain      MODERATE WBC PRESENT,BOTH PMN AND  MONONUCLEAR NO ORGANISMS SEEN Performed at [facility], [address] [place], [place] [address]    Culture PENDING    Report Status PENDING   Glucose, capillary     Status: Abnormal   Collection Time: 01/29/23  1:02 AM  Result Value Ref Range   Glucose-Capillary 55 (L) 70 - 99 mg/dL  Glucose, capillary     Status: Abnormal   Collection Time: 01/29/23  1:41 AM  Result Value Ref Range   Glucose-Capillary 122 (H) 70 - 99 mg/dL  Glucose, capillary     Status: Abnormal   Collection Time: 01/29/23  2:53 AM  Result Value Ref Range   Glucose-Capillary 139 (H) 70 - 99 mg/dL  Glucose, capillary     Status: Abnormal   Collection Time: 01/29/23  4:11 AM  Result Value Ref Range   Glucose-Capillary 121 (H) 70 - 99 mg/dL  CBC     Status: Abnormal   Collection Time: 01/29/23  4:47 AM  Result Value Ref Range   WBC 18.1 (H) 4.0 - 10.5 K/uL   RBC 3.29 (L) 3.87 - 5.11 MIL/uL   Hemoglobin 7.4 (L) 12.0 - 15.0 g/dL   HCT 60.4 (L) 54.0 - 98.1 %   MCV 78.4 (L) 80.0 - 100.0 fL   MCH 22.5 (L) 26.0 - 34.0 pg   MCHC 28.7 (L) 30.0 - 36.0 g/dL   RDW 19.1 (H) 47.8 - 29.5 %   Platelets 162 150 - 400 K/uL   nRBC 0.0 0.0 - 0.2 %  Protime-INR     Status: Abnormal   Collection Time: 01/29/23  4:47 AM  Result Value Ref Range   Prothrombin Time 28.9 (H) 11.4 - 15.2 seconds   INR 2.8 (H) 0.8 - 1.2  Lactic acid, plasma     Status: Abnormal   Collection Time: 01/29/23  4:47 AM  Result Value Ref Range   Lactic Acid, Venous >9.0 (HH) 0.5 - 1.9 mmol/L  Glucose, capillary  Status: None   Collection Time: 01/29/23  7:32 AM  Result Value Ref Range   Glucose-Capillary 96 70 - 99 mg/dL  Protein, total     Status: Abnormal   Collection Time: 01/29/23  7:41 AM  Result Value Ref Range   Total Protein 4.4 (L) 6.5 - 8.1 g/dL  Comprehensive metabolic panel     Status: Abnormal   Collection Time: 01/29/23  7:41 AM  Result Value Ref Range   Sodium 134 (L) 135 - 145 mmol/L   Potassium 4.5 3.5 - 5.1 mmol/L    Chloride 100 98 - 111 mmol/L   CO2 14 (L) 22 - 32 mmol/L   Glucose, Bld 109 (H) 70 - 99 mg/dL   BUN 39 (H) 8 - 23 mg/dL   Creatinine, Ser 7.82 (H) 0.44 - 1.00 mg/dL   Calcium 8.0 (L) 8.9 - 10.3 mg/dL   Total Protein 4.3 (L) 6.5 - 8.1 g/dL   Albumin 1.7 (L) 3.5 - 5.0 g/dL   AST 64 (H) 15 - 41 U/L   ALT 36 0 - 44 U/L   Alkaline Phosphatase 59 38 - 126 U/L   Total Bilirubin 3.3 (H) 0.3 - 1.2 mg/dL   GFR, Estimated 22 (L) >60 mL/min   Anion gap 20 (H) 5 - 15  Magnesium     Status: None   Collection Time: 01/29/23  7:41 AM  Result Value Ref Range   Magnesium 1.9 1.7 - 2.4 mg/dL  Prealbumin     Status: Abnormal   Collection Time: 01/29/23  7:41 AM  Result Value Ref Range   Prealbumin <5 (L) 18 - 38 mg/dL  Procalcitonin     Status: None   Collection Time: 01/29/23  7:41 AM  Result Value Ref Range   Procalcitonin 11.53 ng/mL  Phosphorus     Status: Abnormal   Collection Time: 01/29/23  7:41 AM  Result Value Ref Range   Phosphorus 5.0 (H) 2.5 - 4.6 mg/dL  Urinalysis, Routine w reflex microscopic -Urine, Clean Catch     Status: Abnormal   Collection Time: 01/29/23  9:33 AM  Result Value Ref Range   Color, Urine AMBER (A) YELLOW   APPearance HAZY (A) CLEAR   Specific Gravity, Urine 1.018 1.005 - 1.030   pH 5.0 5.0 - 8.0   Glucose, UA NEGATIVE NEGATIVE mg/dL   Hgb urine dipstick SMALL (A) NEGATIVE   Bilirubin Urine NEGATIVE NEGATIVE   Ketones, ur 5 (A) NEGATIVE mg/dL   Protein, ur NEGATIVE NEGATIVE mg/dL   Nitrite NEGATIVE NEGATIVE   Leukocytes,Ua NEGATIVE NEGATIVE   RBC / HPF 0-5 0 - 5 RBC/hpf   WBC, UA 0-5 0 - 5 WBC/hpf   Bacteria, UA NONE SEEN NONE SEEN   Squamous Epithelial / HPF 0-5 0 - 5 /HPF   Mucus PRESENT    Hyaline Casts, UA PRESENT    Non Squamous Epithelial 0-5 (A) NONE SEEN  Osmolality, urine     Status: None   Collection Time: 01/29/23  9:33 AM  Result Value Ref Range   Osmolality, Ur 420 300 - 900 mOsm/kg  Creatinine, urine, random     Status: None    Collection Time: 01/29/23  9:33 AM  Result Value Ref Range   Creatinine, Urine 167 mg/dL  Sodium, urine, random     Status: None   Collection Time: 01/29/23  9:33 AM  Result Value Ref Range   Sodium, Ur <10 mmol/L  Glucose, capillary     Status: None  Collection Time: 01/29/23 12:03 PM  Result Value Ref Range   Glucose-Capillary 92 70 - 99 mg/dL     US Abdomen Complete  Result Date: 01/28/2023 CLINICAL DATA:  144615 Pain 144615 EXAM: ABDOMEN ULTRASOUND COMPLETE COMPARISON:  None Available. FINDINGS: Gallbladder: No gallstones. Nonspecific gallbladder wall thickening visualized. No sonographic Murphy sign noted by sonographer. Common bile duct: Diameter: 9 mm Liver: Nodular hepatic contour. No focal lesion identified. Increased and coarsened parenchymal echogenicity. Portal vein is patent on color Doppler imaging with normal direction of blood flow towards the liver. IVC: Not visualized. Pancreas: Visualized portion unremarkable. Spleen: Size and appearance within normal limits. Right Kidney: Length: 10.3 cm. Echogenicity within normal limits. No mass or hydronephrosis visualized. Left Kidney: Length: 9.3 cm. Echogenicity within normal limits. No mass or hydronephrosis visualized. Abdominal aorta: Not visualized. Other findings: Moderate to large volume simple free fluid ascites. IMPRESSION: 1. Hepatic steatosis and cirrhosis. No focal liver lesions identified. Please note that liver protocol enhanced MR and CT are the most sensitive tests for the screening detection of hepatocellular carcinoma in the high risk setting of cirrhosis. 2. Moderate to large volume simple free fluid ascites. 3. Nonspecific gallbladder wall thickening which can be seen in the setting of chronic liver disease. 4. Abdominal aorta and IVC not visualized. Electronically Signed   By: Tish Frederickson M.D.   On: 01/28/2023 19:43   DG Chest Portable 1 View  Result Date: 01/28/2023 CLINICAL DATA:  Hypotension EXAM: PORTABLE  CHEST 1 VIEW COMPARISON:  10/07/2017 FINDINGS: Multiple overlying monitoring leads. Heart size appears mildly enlarged. Aortic atherosclerosis. At least 2 small nodular densities project within the right lung, partially obscured by overlying monitoring leads. Possible small left pleural effusion. No pneumothorax. Degenerative changes of the spine and shoulders. IMPRESSION: At least 2 small nodular densities project within the right lung, partially obscured by overlying monitoring leads. Further assessment with CT of the chest is recommended. Electronically Signed   By: Duanne Guess D.O.   On: 01/28/2023 16:29    ROS:  As stated above in the HPI otherwise negative.  Blood pressure (!) 86/61, pulse 98, temperature (!) 96.7 F (35.9 C), temperature source Axillary, resp. rate 20, height 5\' 6"  (1.676 m), weight 58.5 kg, SpO2 93 %.    PE: Gen: NAD, Alert and Oriented HEENT:  [place]/AT, EOMI Neck: Supple, no LAD Lungs: CTA Bilaterally CV: RRR without M/G/R ABD: Soft, NTND, +BS Ext: No C/C/E  Assessment/Plan: 1) SBP. 2) Hypotension. 3) IDA. 4) Hematochezia.   She is currently stable.  Her paracentesis showed that her total nucleated cell count was at 24,900 consistent with SBP.   Currently she is on cefepime, albumin, and levophed.  She remains hypotensive, but there is a positive response.    Plan: 1) Continue with the current care. 2) EGD/colonoscopy pending her medical stability. 3) Agree with iron transfusion. 3) 2 gram sodium diet when her diet is advanced.  Samantha Gentry D 01/29/2023, 4:19 PM

## 2023-01-29 NOTE — Procedures (Signed)
Paracentesis Procedure Note  Samantha Gentry  [id number]  1945/12/08  Date:01/29/23  Time:12:08 AM   Provider Performing:Sheldon Amara Kathie Rhodes Celine Mans    Procedure: Paracentesis with imaging guidance (25366)  Indication(s) Ascites  Consent Risks of the procedure as well as the alternatives and risks of each were explained to the patient and/or caregiver.  Consent for the procedure was obtained and is signed in the bedside chart  Anesthesia Topical only with 1% lidocaine    Time Out Verified patient identification, verified procedure, site/side was marked, verified correct patient position, special equipment/implants available, medications/allergies/relevant history reviewed, required imaging and test results available.   Sterile Technique Maximal sterile technique including full sterile barrier drape, hand hygiene, sterile gown, sterile gloves, mask, hair covering, sterile ultrasound probe cover (if used).   Procedure Description Ultrasound used to identify appropriate peritoneal anatomy for placement and overlying skin marked.  Area of drainage cleaned and draped in sterile fashion. Lidocaine was used to anesthetize the skin and subcutaneous tissue.  60 cc's of turbid, yellow appearing fluid was drained. Catheter then removed and bandaid applied to site.   Complications/Tolerance None; patient tolerated the procedure well.   EBL Minimal   Specimen(s) Peritoneal fluid    Durel Salts, MD Pulmonary and Critical Care Medicine [facility] 01/29/2023 12:08 AM Pager: see AMION  If no response to pager, please call critical care on call (see AMION) until 7pm After 7:00 pm call Elink

## 2023-01-29 NOTE — Progress Notes (Signed)
Hypoglycemic Event  CBG: 56   Treatment: D50 50 mL (25 gm)  Symptoms: None  Follow-up CBG: Time:0141 CBG Result:122  Possible Reasons for Event: Vomiting and Inadequate meal intake  Comments/MD notified:    Elijah Birk

## 2023-01-29 NOTE — Progress Notes (Signed)
This RN informed Celine Mans, MD of pt's CBG of 56. Orders placed for Dextrose 5% in Lactated Ringers. Pharmacy contacted due to this medication not being stored on the unit. Pharmacy informed this RN that materials needed to be contacted for the medication. Materials contacted with delayed response time. Provider contacted again about CBG and medication not being on the unit. New order for 1 ampule (50 ml) to be given. D50 given at 0124 with reassessed CBG at 0141 of 122. Pt remains alert and oriented throughout with no complaints. Dextrose 5% in Lactated Ringers received at 0150. Pt remains alert and oriented the entire time with no concerns or complaints. VSS.

## 2023-01-30 ENCOUNTER — Inpatient Hospital Stay (HOSPITAL_COMMUNITY): Payer: Medicare Other

## 2023-01-30 DIAGNOSIS — L8990 Pressure ulcer of unspecified site, unspecified stage: Secondary | ICD-10-CM | POA: Insufficient documentation

## 2023-01-30 DIAGNOSIS — I471 Supraventricular tachycardia, unspecified: Secondary | ICD-10-CM

## 2023-01-30 DIAGNOSIS — R6521 Severe sepsis with septic shock: Secondary | ICD-10-CM

## 2023-01-30 DIAGNOSIS — A419 Sepsis, unspecified organism: Principal | ICD-10-CM

## 2023-01-30 DIAGNOSIS — E43 Unspecified severe protein-calorie malnutrition: Secondary | ICD-10-CM | POA: Insufficient documentation

## 2023-01-30 DIAGNOSIS — K652 Spontaneous bacterial peritonitis: Secondary | ICD-10-CM

## 2023-01-30 LAB — COMPREHENSIVE METABOLIC PANEL
ALT: 48 U/L — ABNORMAL HIGH (ref 0–44)
AST: 106 U/L — ABNORMAL HIGH (ref 15–41)
Albumin: 2 g/dL — ABNORMAL LOW (ref 3.5–5.0)
Alkaline Phosphatase: 47 U/L (ref 38–126)
Anion gap: 16 — ABNORMAL HIGH (ref 5–15)
BUN: 45 mg/dL — ABNORMAL HIGH (ref 8–23)
CO2: 15 mmol/L — ABNORMAL LOW (ref 22–32)
Calcium: 7.5 mg/dL — ABNORMAL LOW (ref 8.9–10.3)
Chloride: 98 mmol/L (ref 98–111)
Creatinine, Ser: 2.55 mg/dL — ABNORMAL HIGH (ref 0.44–1.00)
GFR, Estimated: 19 mL/min — ABNORMAL LOW (ref >60)
Glucose, Bld: 122 mg/dL — ABNORMAL HIGH (ref 70–99)
Potassium: 4.9 mmol/L (ref 3.5–5.1)
Sodium: 129 mmol/L — ABNORMAL LOW (ref 135–145)
Total Bilirubin: 3 mg/dL — ABNORMAL HIGH (ref 0.3–1.2)
Total Protein: 3.9 g/dL — ABNORMAL LOW (ref 6.5–8.1)

## 2023-01-30 LAB — PROTIME-INR
INR: 4.3 (ref 0.8–1.2)
Prothrombin Time: 41 seconds — ABNORMAL HIGH (ref 11.4–15.2)

## 2023-01-30 LAB — CBC
HCT: 15.6 % — ABNORMAL LOW (ref 36.0–46.0)
HCT: 30.5 % — ABNORMAL LOW (ref 36.0–46.0)
Hemoglobin: 4.8 g/dL — CL (ref 12.0–15.0)
Hemoglobin: 10.2 g/dL — ABNORMAL LOW (ref 12.0–15.0)
MCH: 23.3 pg — ABNORMAL LOW (ref 26.0–34.0)
MCH: 25 pg — ABNORMAL LOW (ref 26.0–34.0)
MCHC: 30.8 g/dL (ref 30.0–36.0)
MCHC: 33.4 g/dL (ref 30.0–36.0)
MCV: 75.7 fL — ABNORMAL LOW (ref 80.0–100.0)
MCV: 74.8 fL — ABNORMAL LOW (ref 80.0–100.0)
Platelets: DECREASED 10*3/uL (ref 150–400)
Platelets: 76 10*3/uL — ABNORMAL LOW (ref 150–400)
RBC: 2.06 MIL/uL — ABNORMAL LOW (ref 3.87–5.11)
RBC: 4.08 MIL/uL (ref 3.87–5.11)
RDW: 20.8 % — ABNORMAL HIGH (ref 11.5–15.5)
RDW: 25.7 % — ABNORMAL HIGH (ref 11.5–15.5)
WBC: 29 10*3/uL — ABNORMAL HIGH (ref 4.0–10.5)
WBC: 27.1 10*3/uL — ABNORMAL HIGH (ref 4.0–10.5)
nRBC: 0.1 % (ref 0.0–0.2)
nRBC: 0.1 % (ref 0.0–0.2)

## 2023-01-30 LAB — GLUCOSE, CAPILLARY
Glucose-Capillary: 139 mg/dL — ABNORMAL HIGH (ref 70–99)
Glucose-Capillary: 177 mg/dL — ABNORMAL HIGH (ref 70–99)
Glucose-Capillary: 173 mg/dL — ABNORMAL HIGH (ref 70–99)
Glucose-Capillary: 145 mg/dL — ABNORMAL HIGH (ref 70–99)

## 2023-01-30 LAB — LACTIC ACID, PLASMA: Lactic Acid, Venous: 9 mmol/L (ref 0.5–1.9)

## 2023-01-30 LAB — DIC (DISSEMINATED INTRAVASCULAR COAGULATION)PANEL
D-Dimer, Quant: 9.36 ug/mL-FEU — ABNORMAL HIGH (ref 0.00–0.50)
Fibrinogen: 138 mg/dL — ABNORMAL LOW (ref 210–475)
INR: 3.5 — ABNORMAL HIGH (ref 0.8–1.2)
Platelets: 80 10*3/uL — ABNORMAL LOW (ref 150–400)
Prothrombin Time: 34.8 seconds — ABNORMAL HIGH (ref 11.4–15.2)
Smear Review: NONE SEEN
aPTT: 73 seconds — ABNORMAL HIGH (ref 24–36)

## 2023-01-30 LAB — TYPE AND SCREEN

## 2023-01-30 LAB — BPAM FFP
Unit Type and Rh: 6200
Unit Type and Rh: 6200

## 2023-01-30 LAB — BPAM RBC
Blood Product Expiration Date: 202405222359
ISSUE DATE / TIME: 202405010904
ISSUE DATE / TIME: 202405010904

## 2023-01-30 LAB — PREPARE FRESH FROZEN PLASMA

## 2023-01-30 LAB — APTT: aPTT: 72 seconds — ABNORMAL HIGH (ref 24–36)

## 2023-01-30 LAB — CULTURE, BLOOD (ROUTINE X 2): Culture: NO GROWTH

## 2023-01-30 LAB — HEMOGLOBIN AND HEMATOCRIT, BLOOD
HCT: 30.1 % — ABNORMAL LOW (ref 36.0–46.0)
Hemoglobin: 10.1 g/dL — ABNORMAL LOW (ref 12.0–15.0)

## 2023-01-30 LAB — AMMONIA: Ammonia: 35 umol/L (ref 9–35)

## 2023-01-30 LAB — PREPARE RBC (CROSSMATCH)

## 2023-01-30 LAB — PREPARE PLATELET PHERESIS

## 2023-01-30 LAB — MAGNESIUM: Magnesium: 2 mg/dL (ref 1.7–2.4)

## 2023-01-30 LAB — PATHOLOGIST SMEAR REVIEW

## 2023-01-30 LAB — BODY FLUID CULTURE W GRAM STAIN

## 2023-01-30 MED ORDER — AMIODARONE HCL IN DEXTROSE 360-4.14 MG/200ML-% IV SOLN
60.0000 mg/h | INTRAVENOUS | Status: AC
  Administered 2023-01-30 (×2): 60 mg/h via INTRAVENOUS
  Filled 2023-01-30 (×3): qty 200

## 2023-01-30 MED ORDER — ADENOSINE 6 MG/2ML IV SOLN
INTRAVENOUS | Status: AC
  Filled 2023-01-30: qty 2

## 2023-01-30 MED ORDER — ADENOSINE 6 MG/2ML IV SOLN: 6.0000 mg | Freq: Once | INTRAVENOUS | Status: AC

## 2023-01-30 MED ORDER — SODIUM CHLORIDE 0.9% IV SOLUTION: Freq: Once | INTRAVENOUS | Status: DC

## 2023-01-30 MED ORDER — MELATONIN 3 MG PO TABS
3.0000 mg | ORAL_TABLET | Freq: Once | ORAL | Status: AC
  Administered 2023-01-30: 3 mg via ORAL
  Filled 2023-01-30: qty 1

## 2023-01-30 MED ORDER — "THROMBI-PAD 3""X3"" EX PADS"
1.0000 | MEDICATED_PAD | Freq: Once | CUTANEOUS | Status: DC
  Filled 2023-01-30: qty 1

## 2023-01-30 MED ORDER — PANTOPRAZOLE SODIUM 40 MG IV SOLR
40.0000 mg | Freq: Two times a day (BID) | INTRAVENOUS | Status: DC
  Administered 2023-01-30 – 2023-01-31 (×3): 40 mg via INTRAVENOUS
  Filled 2023-01-30 (×3): qty 10

## 2023-01-30 MED ORDER — AMIODARONE LOAD VIA INFUSION
150.0000 mg | Freq: Once | INTRAVENOUS | Status: AC
  Administered 2023-01-30: 150 mg via INTRAVENOUS
  Filled 2023-01-30: qty 83.34

## 2023-01-30 MED ORDER — NOREPINEPHRINE 4 MG/250ML-% IV SOLN
0.0000 ug/min | INTRAVENOUS | Status: DC
  Administered 2023-01-30 (×2): 8 ug/min via INTRAVENOUS
  Administered 2023-01-31: 4 ug/min via INTRAVENOUS
  Administered 2023-01-31: 17 ug/min via INTRAVENOUS
  Filled 2023-01-30 (×4): qty 250

## 2023-01-30 MED ORDER — OXIDIZED CELLULOSE EX PADS
1.0000 | MEDICATED_PAD | Freq: Once | CUTANEOUS | Status: AC
  Administered 2023-01-30: 1 via TOPICAL
  Filled 2023-01-30 (×2): qty 1

## 2023-01-30 MED ORDER — ADENOSINE 6 MG/2ML IV SOLN
INTRAVENOUS | Status: AC
  Administered 2023-01-30: 6 mg via INTRAVENOUS
  Filled 2023-01-30: qty 2

## 2023-01-30 MED ORDER — AMIODARONE HCL IN DEXTROSE 360-4.14 MG/200ML-% IV SOLN
30.0000 mg/h | INTRAVENOUS | Status: DC
  Administered 2023-01-30 – 2023-01-31 (×3): 30 mg/h via INTRAVENOUS
  Filled 2023-01-30 (×3): qty 200

## 2023-01-30 MED ORDER — CALCIUM GLUCONATE-NACL 1-0.675 GM/50ML-% IV SOLN
1.0000 g | Freq: Once | INTRAVENOUS | Status: AC
  Administered 2023-01-30: 1000 mg via INTRAVENOUS
  Filled 2023-01-30: qty 50

## 2023-01-30 MED ORDER — SODIUM CHLORIDE 0.9 % IV SOLN
2.0000 g | Freq: Every day | INTRAVENOUS | Status: DC
  Administered 2023-01-30 – 2023-01-31 (×2): 2 g via INTRAVENOUS
  Filled 2023-01-30 (×2): qty 20

## 2023-01-30 MED ORDER — LACTATED RINGERS IV BOLUS
500.0000 mL | Freq: Once | INTRAVENOUS | Status: AC
  Administered 2023-01-30: 500 mL via INTRAVENOUS

## 2023-01-30 MED ORDER — IOHEXOL 9 MG/ML PO SOLN
500.0000 mL | ORAL | Status: AC
  Administered 2023-01-30: 500 mL via ORAL

## 2023-01-30 NOTE — IPAL (Signed)
  Interdisciplinary Goals of Care Family Meeting   Date carried out: 01/30/2023  Location of the meeting: Bedside  Member's involved: Physician, Bedside Registered Nurse, and Family Member or next of kin  Durable Power of Attorney or acting medical decision maker: Self    Discussion: We discussed goals of care for Samantha Gentry .  She has decompensated cirrhosis and is currently in septic shock with evidence of DIC. CT shows likely metastatic HCC. Options for recovery are limited and even if she survives this admission, she is not likely a candidate for any treatment given the extent of the malignancy.  We will continue current treatment of her sepsis and the hope that she will at least stabilize from this.  But will stop short of intubation or CPR and she had apparently previously discussed with her daughter in which they both reaffirmed today.  Code status:   Code Status: DNR no intubation Disposition: Continue current acute care  Time spent for the meeting:    Lynnell Catalan, MD  01/30/2023, 3:40 PM

## 2023-01-30 NOTE — Progress Notes (Signed)
eLink Physician-Brief Progress Note Patient Name: Samantha Gentry DOB: Sep 17, 1946 MRN: [id number]   Date of Service  01/30/2023  HPI/Events of Note  Patient went into SVT with rate in the 200's, blood pressure soft. Rhythm confirmed with 12 lead EKG.  eICU Interventions  Adenosine 6 mg iv x 1 given with termination of SVT, stat BMP and Mg++ ordered.        Thomasene Lot Gasper Hopes 01/30/2023, 1:37 AM

## 2023-01-30 NOTE — Progress Notes (Signed)
eLink Physician-Brief Progress Note Patient Name: Samantha Gentry DOB: Feb 12, 1946 MRN: [id number]   Date of Service  01/30/2023  HPI/Events of Note  Patient with recurrent PSVT. Electrolytes are okay.  eICU Interventions  Amiodarone gtt ordered.        Samantha Gentry 01/30/2023, 4:54 AM

## 2023-01-30 NOTE — Progress Notes (Addendum)
NAME:  Samantha Gentry, MRN:  [id number], DOB:  01-10-1946, LOS: 2 ADMISSION DATE:  01/28/2023, CONSULTATION DATE: 4/29 REFERRING MD: Dr. Adela Glimpse, CHIEF COMPLAINT: Cirrhosis, lactic acidosis  History of Present Illness:  77 year old female with no significant past medical history as she has not seen a physician for many years and takes no medications at baseline.  She does report that she was a heavy drinker for many years, but took her last drink approximately 7 years ago.  She is an active smoker and smokes about 2 cigarettes a day which is down from about a half a pack a day at her peak.  She was in her usual state of health until about 1 month prior to presentation when she had several episodes of bowel incontinence, which eventually became bloody.  She presented to the gastroenterologist with this complaint on 4/17 where there was concern for hepatic cirrhosis.  Dr. Elnoria Howard sent blood work and requested an abdominal ultrasound, however, prior to these being done she presented to [facility] emergency department on 4/29 with complaints of progressive weakness, fatigue, and exceedingly poor exercise tolerance.  She reports she has had 1 episode of chills but no other infectious contacts or complaints.  Does complain of abdominal and lower extremity edema.  Bowel incontinence has decreased but is still present.  Hematochezia has resolved.  PCCM asked to evaluate for lactic acid greater than 9.   Pertinent  Medical History   has a past medical history of Arthritis.   Significant Hospital Events: Including procedures, antibiotic start and stop dates in addition to other pertinent events   4/29 admit with lactic acidosis, cirrhosis, possible sepsis; started on levo 4/30 paracentesis performed w/ 60 cc of cloudy fluid drained 5/1 overnight SVT refractory to adenosine and required amio; hgb 4.8 being transfused  Interim History / Subjective:  Overnight patient w/ SVT 200s given adenosine w/ brief  improvement. Electrolytes wnl. Patient back in SVT requiring amio.  Patient alert and denies any pain On 8 mcg levo and amio iv Still has poor appetite   Objective   Blood pressure 93/61, pulse 72, temperature 98.2 F (36.8 C), temperature source Oral, resp. rate 16, height 5\' 6"  (1.676 m), weight 63.3 kg, SpO2 100 %.        Intake/Output Summary (Last 24 hours) at 01/30/2023 0705 Last data filed at 01/30/2023 0700 Gross per 24 hour  Intake 4147.13 ml  Output 450 ml  Net 3697.13 ml    Filed Weights   01/28/23 2330 01/29/23 0416 01/30/23 0437  Weight: 58.4 kg 58.5 kg 63.3 kg    Examination: General:  pale appearing female in NAD HEENT: MM pink/moist Neuro: Aox3; MAE CV: s1s2, RRR w/ rate in 70s, no m/r/g PULM:  dim clear BS bilaterally; on 6L [place] GI: moderately distended abd soft w/ no pain on palpation, bsx4 active  Extremities: warm/dry, no edema  Skin: no rashes or lesions appreciated  Echo 5/1: LVEF >75%  Resolved Hospital Problem list     Assessment & Plan:   Hepatic cirrhosis: newly diagnosed: Former drinker, quit about 7 years ago. MELD 29. Ammonia wnl.  Likely SBP: b P: -GI consulted; appreciate recs; followed by Dr. Elnoria Howard outpt -trend cmp and coags -cont rocephin for SBP -f/u cultures from paracentesis  Hypotension/Shock: etiology unclear. Sepsis vs intravascular depletion. BP's borderline, but with lactic 9 low threshold to treat more aggressively.  Lactic acidosis: etiology not entirely clear. No home medications. BP borderline low, but seems to be  perfusing well. ? Related to dyspnea/work of breathing and not clearing due to hepatic dysfunction P: -wean levo for map goal >65 -cont rocephin; f/u cultures -trend LA  Anemia: recent rectal bleeding, which has subsided per patient.  ? GIB Coagulopathy: likely secondary to liver dz P: -hgb 4.8 this am; transfuse 3 units -H/H post transfusion -will check CT abd/pelvis -Dr. Elnoria Howard updated; appreciate recs;  wants to hold off on repleting FFP and platelets given no active GI bleeding -PPI BID -trend cbc and coags  SVT -given adenosine and initially responded then ultimately required amio  P: -tele monitoring -trend electrolytes and replete as needed -cont amio  AKI AGMA Hyponatremia P: -Trend CMP / urinary output -Replace electrolytes as indicated -Avoid nephrotoxic agents, ensure adequate renal perfusion  Hypoglycemia P: -on bicarb w/ dextrose -CBG monitoring  Tobacco abuse 2 small right lung nodular densities P: -smoking cessation counseling -incidental finding; hard to asses w/ CXR; will check CT chest   Best Practice (right click and "Reselect all SmartList Selections" daily)   Diet/type: NPO w/ oral meds DVT prophylaxis: SCD GI prophylaxis: PPI Lines: N/A Foley:  N/A Code Status:  full code Last date of multidisciplinary goals of care discussion [4/30 updated pt at bedside]  Labs   CBC: Recent Labs  Lab 01/28/23 1557 01/28/23 1741 01/28/23 1759 01/29/23 0447  WBC 28.9*  --   --  18.1*  NEUTROABS 27.2*  --   --   --   HGB 7.8* 7.1* 7.5* 7.4*  HCT 26.3* 21.0* 22.0* 25.8*  MCV 77.1*  --   --  78.4*  PLT 232  --   --  162     Basic Metabolic Panel: Recent Labs  Lab 01/28/23 1557 01/28/23 1741 01/28/23 1759 01/28/23 1950 01/29/23 0741 01/30/23 0143  NA 129* 129* 129*  --  134* 129*  K 4.8 4.8 4.9  --  4.5 4.9  CL 94* 100  --   --  100 98  CO2 13*  --   --   --  14* 15*  GLUCOSE 50* 44*  --   --  109* 122*  BUN 34* 34*  --   --  39* 45*  CREATININE 2.29* 2.30*  --   --  2.24* 2.55*  CALCIUM 8.2*  --   --   --  8.0* 7.5*  MG 2.1  --   --   --  1.9 2.0  PHOS  --   --   --  4.7* 5.0*  --     GFR: Estimated Creatinine Clearance: 17.6 mL/min (A) (by C-G formula based on SCr of 2.55 mg/dL (H)). Recent Labs  Lab 01/28/23 1557 01/28/23 1733 01/28/23 1950 01/29/23 0447 01/29/23 0741 01/29/23 1555  PROCALCITON  --   --   --   --  11.53  --    WBC 28.9*  --   --  18.1*  --   --   LATICACIDVEN  --  >9.0* >9.0* >9.0*  --  >9.0*     Liver Function Tests: Recent Labs  Lab 01/28/23 1557 01/29/23 0741 01/30/23 0143  AST 76* 64* 106*  ALT 47* 36 48*  ALKPHOS 92 59 47  BILITOT 3.5* 3.3* 3.0*  PROT 5.1* 4.3*  4.4* 3.9*  ALBUMIN 1.8* 1.7* 2.0*    No results for input(s): "LIPASE", "AMYLASE" in the last 168 hours. Recent Labs  Lab 01/28/23 1733  AMMONIA 23     ABG    Component Value Date/Time  HCO3 17.6 (L) 01/28/2023 1950   TCO2 15 (L) 01/28/2023 1759   ACIDBASEDEF 7.8 (H) 01/28/2023 1950   O2SAT 21.5 01/28/2023 1950     Coagulation Profile: Recent Labs  Lab 01/28/23 1950 01/29/23 0447  INR 2.4* 2.8*     Cardiac Enzymes: Recent Labs  Lab 01/28/23 1950  CKTOTAL 127     HbA1C: Hgb A1c MFr Bld  Date/Time Value Ref Range Status  01/28/2023 07:50 PM 4.4 (L) 4.8 - 5.6 % Final    Comment:    (NOTE) Pre diabetes:          5.7%-6.4%  Diabetes:              >6.4%  Glycemic control for   <7.0% adults with diabetes     CBG: Recent Labs  Lab 01/29/23 1203 01/29/23 1645 01/29/23 1944 01/29/23 2343 01/30/23 0400  GLUCAP 92 90 109* 135* 139*     Review of Systems:    Bolds are positive  Constitutional: weight loss, gain, night sweats, Fevers, chills, fatigue .  HEENT: headaches, Sore throat, sneezing, nasal congestion, post nasal drip, Difficulty swallowing, Tooth/dental problems, visual complaints visual changes, ear ache CV:  chest pain, radiates:,Orthopnea, PND, swelling in lower extremities, dizziness, palpitations, syncope.  GI  heartburn, indigestion, abdominal pain, nausea, vomiting, diarrhea, change in bowel habits, loss of appetite, bloody stools.  Resp: cough, productive: , hemoptysis, dyspnea, chest pain, pleuritic.  Skin: rash or itching or icterus GU: dysuria, change in color of urine, urgency or frequency. flank pain, hematuria  MS: joint pain or swelling. decreased range of  motion  Psych: change in mood or affect. depression or anxiety.  Neuro: difficulty with speech, weakness, numbness, ataxia    Past Medical History:  She,  has a past medical history of Arthritis.   Surgical History:  History reviewed. No pertinent surgical history.   Social History:   reports that she has been smoking cigarettes. She does not have any smokeless tobacco history on file. She reports that she does not currently use alcohol. She reports that she does not use drugs.   Family History:  Her family history is not on file.   Allergies No Known Allergies   Home Medications  Prior to Admission medications   Not on File     Critical care time: 35 minutes     JD Anselm Lis [facility] 01/30/2023, 7:05 AM  Please see [email or web address] for pager details.  From 7A-7P if no response, please call [phone]. After hours, please call ELink [phone].

## 2023-01-30 NOTE — Progress Notes (Signed)
eLink Physician-Brief Progress Note Patient Name: Samantha Gentry DOB: 29-Oct-1945 MRN: [id number]   Date of Service  01/30/2023  HPI/Events of Note  Patient with urinary retention.  eICU Interventions  In / Out bladder catheterization ordered.        Thomasene Lot Sael Furches 01/30/2023, 3:11 AM

## 2023-01-30 NOTE — Procedures (Signed)
Central Venous Catheter Insertion Procedure Note  Samantha Gentry  [id number]  01-Nov-1945  Date:01/30/23  Time:10:49 AM   Provider Performing:[place] [facility] Suzie Portela   Procedure: Insertion of Non-tunneled Central Venous [phone]) with US guidance ([id number])    Indication(s) Medication administration  Consent Risks of the procedure as well as the alternatives and risks of each were explained to the patient and/or caregiver.  Consent for the procedure was obtained and is signed in the bedside chart  Anesthesia Topical only with 1% lidocaine   Timeout Verified patient identification, verified procedure, site/side was marked, verified correct patient position, special equipment/implants available, medications/allergies/relevant history reviewed, required imaging and test results available.  Sterile Technique Maximal sterile technique including full sterile barrier drape, hand hygiene, sterile gown, sterile gloves, mask, hair covering, sterile ultrasound probe cover (if used).  Procedure Description Area of catheter insertion was cleaned with chlorhexidine and draped in sterile fashion.  With real-time ultrasound guidance a central venous catheter was placed into the right femoral vein. Nonpulsatile blood flow and easy flushing noted in all ports.  The catheter was sutured in place and sterile dressing applied.  Complications/Tolerance None; patient tolerated the procedure well. Chest X-ray is ordered to verify placement for internal jugular or subclavian cannulation.   Chest x-ray is not ordered for femoral cannulation.  EBL Minimal  Specimen(s) None  JD Anselm Lis [facility] 01/30/2023, 10:50 AM  Please see [email or web address] for pager details.  From 7A-7P if no response, please call [phone]. After hours, please call ELink [phone].

## 2023-01-30 DEATH — deceased

## 2023-01-31 DIAGNOSIS — Z7189 Other specified counseling: Secondary | ICD-10-CM

## 2023-01-31 DIAGNOSIS — Z515 Encounter for palliative care: Secondary | ICD-10-CM

## 2023-01-31 LAB — BPAM RBC
Blood Product Expiration Date: 202405232359
Blood Product Expiration Date: 202405232359
ISSUE DATE / TIME: 202405010808
Unit Type and Rh: 6200
Unit Type and Rh: 6200
Unit Type and Rh: 6200

## 2023-01-31 LAB — CBC
HCT: 26.4 % — ABNORMAL LOW (ref 36.0–46.0)
Hemoglobin: 9.1 g/dL — ABNORMAL LOW (ref 12.0–15.0)
MCH: 24.8 pg — ABNORMAL LOW (ref 26.0–34.0)
MCHC: 34.5 g/dL (ref 30.0–36.0)
MCV: 71.9 fL — ABNORMAL LOW (ref 80.0–100.0)
Platelets: 48 10*3/uL — ABNORMAL LOW (ref 150–400)
RBC: 3.67 MIL/uL — ABNORMAL LOW (ref 3.87–5.11)
RDW: 25.6 % — ABNORMAL HIGH (ref 11.5–15.5)
WBC: 32.5 10*3/uL — ABNORMAL HIGH (ref 4.0–10.5)
nRBC: 0.1 % (ref 0.0–0.2)

## 2023-01-31 LAB — DIC (DISSEMINATED INTRAVASCULAR COAGULATION)PANEL
D-Dimer, Quant: 12.83 ug/mL-FEU — ABNORMAL HIGH (ref 0.00–0.50)
Fibrinogen: 104 mg/dL — ABNORMAL LOW (ref 210–475)
INR: 3.1 — ABNORMAL HIGH (ref 0.8–1.2)
Platelets: 46 10*3/uL — ABNORMAL LOW (ref 150–400)
Prothrombin Time: 31.4 seconds — ABNORMAL HIGH (ref 11.4–15.2)
aPTT: 74 seconds — ABNORMAL HIGH (ref 24–36)

## 2023-01-31 LAB — BPAM PLATELET PHERESIS
Blood Product Expiration Date: 202405032359
Unit Type and Rh: 5100

## 2023-01-31 LAB — COMPREHENSIVE METABOLIC PANEL
ALT: 47 U/L — ABNORMAL HIGH (ref 0–44)
AST: 85 U/L — ABNORMAL HIGH (ref 15–41)
Albumin: 1.5 g/dL — ABNORMAL LOW (ref 3.5–5.0)
Alkaline Phosphatase: 44 U/L (ref 38–126)
Anion gap: 13 (ref 5–15)
BUN: 54 mg/dL — ABNORMAL HIGH (ref 8–23)
CO2: 25 mmol/L (ref 22–32)
Calcium: 7.3 mg/dL — ABNORMAL LOW (ref 8.9–10.3)
Chloride: 88 mmol/L — ABNORMAL LOW (ref 98–111)
Creatinine, Ser: 2.44 mg/dL — ABNORMAL HIGH (ref 0.44–1.00)
GFR, Estimated: 20 mL/min — ABNORMAL LOW (ref >60)
Glucose, Bld: 160 mg/dL — ABNORMAL HIGH (ref 70–99)
Potassium: 3.8 mmol/L (ref 3.5–5.1)
Sodium: 126 mmol/L — ABNORMAL LOW (ref 135–145)
Total Bilirubin: 6.1 mg/dL — ABNORMAL HIGH (ref 0.3–1.2)
Total Protein: 3.6 g/dL — ABNORMAL LOW (ref 6.5–8.1)

## 2023-01-31 LAB — TYPE AND SCREEN
ABO/RH(D): A POS
Antibody Screen: NEGATIVE
Unit division: 0
Unit division: 0
Unit division: 0

## 2023-01-31 LAB — HEMOGLOBIN AND HEMATOCRIT, BLOOD
HCT: 26.9 % — ABNORMAL LOW (ref 36.0–46.0)
Hemoglobin: 9.2 g/dL — ABNORMAL LOW (ref 12.0–15.0)

## 2023-01-31 LAB — GLUCOSE, CAPILLARY
Glucose-Capillary: 146 mg/dL — ABNORMAL HIGH (ref 70–99)
Glucose-Capillary: 150 mg/dL — ABNORMAL HIGH (ref 70–99)
Glucose-Capillary: 98 mg/dL (ref 70–99)
Glucose-Capillary: 119 mg/dL — ABNORMAL HIGH (ref 70–99)

## 2023-01-31 LAB — AMMONIA: Ammonia: 70 umol/L — ABNORMAL HIGH (ref 9–35)

## 2023-01-31 LAB — APTT: aPTT: 72 seconds — ABNORMAL HIGH (ref 24–36)

## 2023-01-31 LAB — PREPARE PLATELET PHERESIS: Unit division: 0

## 2023-01-31 LAB — BODY FLUID CULTURE W GRAM STAIN

## 2023-01-31 LAB — CYTOLOGY - NON PAP

## 2023-01-31 LAB — OCCULT BLOOD X 1 CARD TO LAB, STOOL: Fecal Occult Bld: POSITIVE — AB

## 2023-01-31 MED ORDER — ONDANSETRON HCL 4 MG/2ML IJ SOLN
4.0000 mg | Freq: Four times a day (QID) | INTRAMUSCULAR | Status: DC | PRN
  Administered 2023-01-31: 4 mg via INTRAVENOUS
  Filled 2023-01-31: qty 2

## 2023-01-31 MED ORDER — ARFORMOTEROL TARTRATE 15 MCG/2ML IN NEBU
15.0000 ug | INHALATION_SOLUTION | Freq: Two times a day (BID) | RESPIRATORY_TRACT | Status: DC
  Administered 2023-01-31: 15 ug via RESPIRATORY_TRACT
  Filled 2023-01-31: qty 2

## 2023-01-31 MED ORDER — HYDROMORPHONE BOLUS VIA INFUSION
0.5000 mg | INTRAVENOUS | Status: DC | PRN
  Administered 2023-01-31 – 2023-02-01 (×10): 0.5 mg via INTRAVENOUS

## 2023-01-31 MED ORDER — ACETAMINOPHEN 650 MG RE SUPP: 650.0000 mg | Freq: Four times a day (QID) | RECTAL | Status: DC | PRN

## 2023-01-31 MED ORDER — BUDESONIDE 0.5 MG/2ML IN SUSP
0.5000 mg | Freq: Two times a day (BID) | RESPIRATORY_TRACT | Status: DC
  Administered 2023-01-31: 0.5 mg via RESPIRATORY_TRACT
  Filled 2023-01-31: qty 2

## 2023-01-31 MED ORDER — ACETAMINOPHEN 325 MG PO TABS: 650.0000 mg | ORAL_TABLET | Freq: Four times a day (QID) | ORAL | Status: DC | PRN

## 2023-01-31 MED ORDER — BIOTENE DRY MOUTH MT LIQD: 15.0000 mL | OROMUCOSAL | Status: DC | PRN

## 2023-01-31 MED ORDER — DEXMEDETOMIDINE HCL IN NACL 400 MCG/100ML IV SOLN
0.0000 ug/kg/h | INTRAVENOUS | Status: DC
  Administered 2023-01-31: 0.4 ug/kg/h via INTRAVENOUS
  Filled 2023-01-31: qty 100

## 2023-01-31 MED ORDER — METRONIDAZOLE 500 MG/100ML IV SOLN
500.0000 mg | Freq: Two times a day (BID) | INTRAVENOUS | Status: DC
  Administered 2023-01-31: 500 mg via INTRAVENOUS
  Filled 2023-01-31: qty 100

## 2023-01-31 MED ORDER — ONDANSETRON HCL 4 MG/2ML IJ SOLN
INTRAMUSCULAR | Status: AC
  Administered 2023-01-31: 4 mg via INTRAVENOUS
  Filled 2023-01-31: qty 2

## 2023-01-31 MED ORDER — POLYVINYL ALCOHOL 1.4 % OP SOLN: 1.0000 [drp] | Freq: Four times a day (QID) | OPHTHALMIC | Status: DC | PRN

## 2023-01-31 MED ORDER — GLYCOPYRROLATE 0.2 MG/ML IJ SOLN: 0.4000 mg | INTRAMUSCULAR | Status: DC | PRN

## 2023-01-31 MED ORDER — IPRATROPIUM-ALBUTEROL 0.5-2.5 (3) MG/3ML IN SOLN
3.0000 mL | RESPIRATORY_TRACT | Status: DC
  Administered 2023-01-31: 3 mL via RESPIRATORY_TRACT
  Filled 2023-01-31: qty 3

## 2023-01-31 MED ORDER — HYDROMORPHONE HCL-NACL 50-0.9 MG/50ML-% IV SOLN
1.0000 mg/h | INTRAVENOUS | Status: DC
  Administered 2023-01-31: 1 mg/h via INTRAVENOUS
  Filled 2023-01-31: qty 50

## 2023-01-31 MED ORDER — IPRATROPIUM-ALBUTEROL 0.5-2.5 (3) MG/3ML IN SOLN
3.0000 mL | RESPIRATORY_TRACT | Status: DC | PRN
  Administered 2023-01-31: 3 mL via RESPIRATORY_TRACT
  Filled 2023-01-31: qty 3

## 2023-01-31 NOTE — Progress Notes (Signed)
NAME:  Samantha Gentry, MRN:  [id number], DOB:  01-11-1946, LOS: 3 ADMISSION DATE:  01/28/2023, CONSULTATION DATE: 4/29 REFERRING MD: Dr. Adela Glimpse, CHIEF COMPLAINT: Cirrhosis, lactic acidosis  History of Present Illness:  77 year old female with no significant past medical history as she has not seen a physician for many years and takes no medications at baseline.  She does report that she was a heavy drinker for many years, but took her last drink approximately 7 years ago.  She is an active smoker and smokes about 2 cigarettes a day which is down from about a half a pack a day at her peak.  She was in her usual state of health until about 1 month prior to presentation when she had several episodes of bowel incontinence, which eventually became bloody.  She presented to the gastroenterologist with this complaint on 4/17 where there was concern for hepatic cirrhosis.  Dr. Elnoria Howard sent blood work and requested an abdominal ultrasound, however, prior to these being done she presented to [facility] emergency department on 4/29 with complaints of progressive weakness, fatigue, and exceedingly poor exercise tolerance.  She reports she has had 1 episode of chills but no other infectious contacts or complaints.  Does complain of abdominal and lower extremity edema.  Bowel incontinence has decreased but is still present.  Hematochezia has resolved.  PCCM asked to evaluate for lactic acid greater than 9.   Pertinent  Medical History   has a past medical history of Arthritis.  Significant Hospital Events: Including procedures, antibiotic start and stop dates in addition to other pertinent events   4/29 admit with lactic acidosis, cirrhosis, possible sepsis; started on levo 4/30 paracentesis performed w/ 60 cc of cloudy fluid drained 5/1 overnight SVT refractory to adenosine and required amio; hgb 4.8 being transfused  Interim History / Subjective:  DNR/ DNI as of yesterday afternoon NE down to 4 mcg Remains  on amio Afebrile, WBC up  Small volume coffee ground emesis this morning and remains intermittently confused and restless with mild residual nausea after zofran, otherwise no complaints.  Ongoing dark tarry stools  Objective   Blood pressure 134/68, pulse 95, temperature 97.7 F (36.5 C), temperature source Axillary, resp. rate (!) 24, height 5\' 6"  (1.676 m), weight 64.2 kg, SpO2 90 %.        Intake/Output Summary (Last 24 hours) at 01/31/2023 0720 Last data filed at 01/31/2023 0700 Gross per 24 hour  Intake 6304.27 ml  Output 100 ml  Net 6204.27 ml   Filed Weights   01/29/23 0416 01/30/23 0437 01/31/23 0500  Weight: 58.5 kg 63.3 kg 64.2 kg    Examination: General:  chronically ill appearing elderly female lying in bed in NAD HEENT: MM pink/moist with residual coffee ground residue in mouth, pupils 2/r, +icterus Neuro: awake, oriented to person, place, April 2024 CV: rr, NSR, no murmur PULM:   4L [place], easily desaturates off [place], non labored, diffuse prolonged expiratory wheeze, few right basilar rales GI: distended, hypoBS, NT, +fluid wave, foley with scant dk amber urine Extremities: warm/dry, anasarca- weeping at R femoral CVL site, LLE > RLE Skin: ecchymosis to arms   UOP 100 ml/ 24hrs +6.2L/ 24hrs Net +12.7L Afebrile Labs> WBC 27.1> 32.5, H/H 10.2/ 30.5> 9.1/ 26.4, plts 80> 48, Na 126, K 3.8, BUN/ sCr 45/ 2.55> 54/ 2.44, AST/ ALT better, t. Bili 3> 6.1 CBGs stable 150-177   Echo 5/1: LVEF >75%  Resolved Hospital Problem list     Assessment &  Plan:   Decompensated Hepatic ETOH Cirrhosis: treated for in 2019, lost in follow-up with GI.  Former drinker, quit about 7 years ago. MELD 29. Ammonia wnl.  SBP Suspected metastatic hepatocellular carcinoma based on CT c/a/p > multi large hepatic lesions, suspected cecal mass, large ascites with mesenteric edema and suspected peritoneal carcinomatosis, numerous bilateral pulmonary nodules w/ pleural effusion, bilateral adrenal  gland lesions P: - [facility] following; appreciate recs; followed by Dr. Elnoria Howard outpt - add on cytology to peritoneal fluid> hopefully will have results by 5/3 - cont CTX for SBP as below - f/u cultures > paracentesis (ngtd) and Bcx2 (ngtd x2) - EGD/ colonoscopy when stabilizes - when taking PO's> 2gm sodium diet - zofran prn N/V - ongoing GOC discussions given MODS, ABLA, shock, AKI, GIB, coagulopathy.  Remains DNR/ DNI  Acute encephalopathy - intermittent confusion, worsening restlessness today.  Likely multifactorial septic shock, AKI, ?hepatic, +/- metastatic disease - recheck ammonia - precedex prn for RASS goal 0/-1 - delirium precautions - ongoing supportive care and treatment as below - consider CTH/ MRI brain to rule out IC metastatic disease  Septic shock 2/2 SBP Lactic acidosis P: - wean levo for map goal >65 - cont rocephin, add flagyl.  Pending BC> organism growing in aerobic plate - could also be aspiration component with N/V, abx will cover, no increased O2 needs - trend WBC/ fever curve  Anemia: recent rectal bleeding, which has subsided per patient.  GIB Coagulopathy/ DIC: likely secondary to liver dz - s/p 3u PRBC 5/1 for Hgb 4.8 P: - having small volume coffee ground emesis today.  NPO.  PPI BID - Hgb better but drifting down.  Repeat Hgb now, transfuse for Hgb < 7 but given suspected metastatic disease and MODS, options may be limited - on DIC panel, factors consumed, reported neg schistocytes, however on manual review by Dr. Denese Killings, schistocytes noted.   - repeat DIC panel now> given new coffee ground emesis, replete factors prn per GI recs - PPI BID -trend cbc and coags - no iron infusions while septic   SVT -given adenosine and initially responded then ultimately required amio  P: -tele monitoring, remains NSR - cont amio - goal K> 4, Mag > 2, replete prn  AKI AGMA Hyponatremia P: - remains oliguric, worsening anasarca, sCr stable, BUN 45>  54, and Na 129> 126 2/2 hypervolemia - bicarb 25, decrease bicarb gtt to 50 - Trend BMP / urinary output/ strict I/Os - Replace electrolytes as indicated - Avoid nephrotoxic agents, ensure adequate renal perfusion  Hypoglycemia P: - glucose stable on bicarb w/ dextrose - cont q4 CBGs  Suspected COPD Hypoxic respiratory failure  R/o aspiration Tobacco abuse Bilateral pulmonary nodules P: - cont supplemental O2 for goal sat ~94% - wheezing today> change to duoneb q4, adding brovana/ Pulmicort - hold steroids given suspected GIB - pulmonary hygiene, aspiration precautions - smoking cessation counseling - suspected metastatic disease - remains DNR/ DNI  Protein calorie malnutrition - NPO currently with coffee ground emesis  Left lower extremity swelling - hold on imaging pending GOC  Best Practice (right click and "Reselect all SmartList Selections" daily)   Diet/type: NPO w/ oral meds DVT prophylaxis: SCD GI prophylaxis: PPI Lines: Central line Foley:  Yes, and it is still needed Code Status:  DNR Last date of multidisciplinary goals of care discussion [4/30 updated pt at bedside]  Pending 5/2.   Labs   CBC: Recent Labs  Lab 01/28/23 1557 01/28/23 1741 01/29/23 0447 01/30/23  1610 01/30/23 1213 01/30/23 1348 01/31/23 0300  WBC 28.9*  --  18.1* 29.0* 27.1*  --  32.5*  NEUTROABS 27.2*  --   --   --   --   --   --   HGB 7.8*   < > 7.4* 4.8* 10.2* 10.1* 9.1*  HCT 26.3*   < > 25.8* 15.6* 30.5* 30.1* 26.4*  MCV 77.1*  --  78.4* 75.7* 74.8*  --  71.9*  PLT 232  --  162 PLATELETS APPEAR DECREASED 80*  76*  --  48*   < > = values in this interval not displayed.    Basic Metabolic Panel: Recent Labs  Lab 01/28/23 1557 01/28/23 1741 01/28/23 1759 01/28/23 1950 01/29/23 0741 01/30/23 0143 01/31/23 0300  NA 129* 129* 129*  --  134* 129* 126*  K 4.8 4.8 4.9  --  4.5 4.9 3.8  CL 94* 100  --   --  100 98 88*  CO2 13*  --   --   --  14* 15* 25  GLUCOSE 50* 44*   --   --  109* 122* 160*  BUN 34* 34*  --   --  39* 45* 54*  CREATININE 2.29* 2.30*  --   --  2.24* 2.55* 2.44*  CALCIUM 8.2*  --   --   --  8.0* 7.5* 7.3*  MG 2.1  --   --   --  1.9 2.0  --   PHOS  --   --   --  4.7* 5.0*  --   --    GFR: Estimated Creatinine Clearance: 18.4 mL/min (A) (by C-G formula based on SCr of 2.44 mg/dL (H)). Recent Labs  Lab 01/28/23 1950 01/29/23 0447 01/29/23 0741 01/29/23 1555 01/30/23 0632 01/30/23 0639 01/30/23 1213 01/31/23 0300  PROCALCITON  --   --  11.53  --   --   --   --   --   WBC  --  18.1*  --   --   --  29.0* 27.1* 32.5*  LATICACIDVEN >9.0* >9.0*  --  >9.0* 9.0*  --   --   --     Liver Function Tests: Recent Labs  Lab 01/28/23 1557 01/29/23 0741 01/30/23 0143 01/31/23 0300  AST 76* 64* 106* 85*  ALT 47* 36 48* 47*  ALKPHOS 92 59 47 44  BILITOT 3.5* 3.3* 3.0* 6.1*  PROT 5.1* 4.3*  4.4* 3.9* 3.6*  ALBUMIN 1.8* 1.7* 2.0* 1.5*   No results for input(s): "LIPASE", "AMYLASE" in the last 168 hours. Recent Labs  Lab 01/28/23 1733 01/30/23 0632  AMMONIA 23 35    ABG    Component Value Date/Time   HCO3 17.6 (L) 01/28/2023 1950   TCO2 15 (L) 01/28/2023 1759   ACIDBASEDEF 7.8 (H) 01/28/2023 1950   O2SAT 21.5 01/28/2023 1950     Coagulation Profile: Recent Labs  Lab 01/28/23 1950 01/29/23 0447 01/30/23 0639 01/30/23 1213  INR 2.4* 2.8* 4.3* 3.5*    Cardiac Enzymes: Recent Labs  Lab 01/28/23 1950  CKTOTAL 127    HbA1C: Hgb A1c MFr Bld  Date/Time Value Ref Range Status  01/28/2023 07:50 PM 4.4 (L) 4.8 - 5.6 % Final    Comment:    (NOTE) Pre diabetes:          5.7%-6.4%  Diabetes:              >6.4%  Glycemic control for   <7.0% adults with diabetes  CBG: Recent Labs  Lab 01/30/23 0400 01/30/23 0827 01/30/23 1932 01/30/23 2327 01/31/23 0415  GLUCAP 139* 177* 173* 145* 146*    Critical care time: 36 minutes       Posey Boyer, MSN, AG-ACNP-BC [facility] 01/31/2023, 7:20 AM  See Amion for pager If no response to pager, please call PCCM consult pager After 7:00 pm call Elink

## 2023-01-31 NOTE — Consult Note (Signed)
Palliative Medicine Inpatient Consult Note  Consulting Provider:  Lynnell Catalan, MD   Reason for consult:   Palliative Care Consult Services Palliative Medicine Consult  Reason for Consult? Advanced liver failure.  New diagnosis of advanced hepatic cancer.   01/31/2023  HPI:  Per intake H&P --> 77 year old female with h/o alcohol and cigarette use. Has not seen a physician in seven years. On admission in septic shock from peritonitis. CT w/ multiples lesions worrisome for liver CA. Patient on high pressor requirements and overall not doing well. Palliative care has been asked to get involved due to failing physical health and suspected unsurvivable.   Clinical Assessment/Goals of Care:  *Please note that this is a verbal dictation therefore any spelling or grammatical errors are due to the "Dragon [employer] One" system interpretation.  I have reviewed medical records including EPIC notes, labs and imaging, received report from bedside RN, assessed the patient who was resting in bed on a Precedex drip.    I met with patient's 2 daughters, Shirl Harris and Helyn Numbers to further discuss diagnosis prognosis, GOC, EOL wishes, disposition and options.   I introduced Palliative Medicine as specialized medical care for people living with serious illness. It focuses on providing relief from the symptoms and stress of a serious illness. The goal is to improve quality of life for both the patient and the family.  Medical History Review and Understanding:  A review of patient's past medical history was held in the setting of both her drinking and smoking.  Patient's 2 daughters expressed that Ceazia never likes going to the doctors -as a result of this they believe she is in the present position she is in.  Social History:  Amiyrah is from [facility] since the age of 75.  She has 2 daughters and 5 grandsons.  She is widowed.  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  She is a woman of faith and enjoyed participating in service.  Functional and Nutritional State:  About a week prior to admission Tamicka was fully mobile and still driving her grandchildren to and from school.  She was attending to all of her own B ADLs and IADLs.  Advance Directives:  A detailed discussion was had today regarding advanced directives.  Patient's daughters make decisions with each other and although she does not have written advanced directive she had shared with them in the past what her wishes would and would not be.  Code Status:  Concepts specific to code status, artifical feeding and hydration, continued IV antibiotics and rehospitalization was had.  The difference between a aggressive medical intervention path  and a palliative comfort care path for this patient at this time was had.   Carlee is an established do not attempt resuscitation/DO NOT INTUBATE CODE STATUS.  Discussion:  Formal conversations were held in the setting of Nicolena's declining health state.  We discussed there is septic shock in the setting of peritonitis.  We discussed her known liver cirrhosis.  We further reviewed that she has failure of her liver and unfortunately metastatic cancer.  We discussed that she also is having episodes of bleeding as well as impairment to her kidneys.  We discussed the options from here the first would be to continue present management hoping for improvement and placing Cerra through additional cumbersome medical interventions.  Alternatively we discussed focusing on Jalacia's comfort and dignity at the end of her life.  This is very much aligned  with the patients goals for herself per her daughters who had had some of these difficult conversations with her when she was in her right state of mind.  Patient's daughters would like their sons to have the opportunity to visit with Errin and afterwards to pursue comfort oriented care.  I shared that we  would start low-dose opioid drip to keep her symptoms under good control.  Once everyone has visited we would then stop pressor support at which time it would not be clear how long or short her duration of life would be.  We discussed including our chaplain support team for patient's grandchildren.  Discussed the importance of continued conversation with family and their  medical providers regarding overall plan of care and treatment options, ensuring decisions are within the context of the patients values and GOCs.  Decision Maker: Shirl Harris (Daughter): [phone] [facility] Phone)   SUMMARY OF RECOMMENDATIONS   DNAR/DNI  Patients family plan to visit this afternoon and pursue comfort care thereafter  Appreciate Chaplain Support  Ongoing PMT support  Code Status/Advance Care Planning: DNAR/DNI  Palliative Prophylaxis:  Aspiration, Bowel Regimen, Delirium Protocol, Frequent Pain Assessment, Oral Care, Palliative Wound Care, and Turn Reposition  Additional Recommendations (Limitations, Scope, Preferences): Continue current care  Psycho-social/Spiritual:  Desire for further Chaplaincy support: Yes Additional Recommendations: Education on end of life process   Prognosis: Limited overall  Discharge Planning: Discharge will be celestial  Vitals:   01/31/23 0645 01/31/23 0700  BP: 126/72 134/68  Pulse: 93 95  Resp: (!) 24 (!) 24  Temp:    SpO2: (!) 85% 90%    Intake/Output Summary (Last 24 hours) at 01/31/2023 9629 Last data filed at 01/31/2023 0700 Gross per 24 hour  Intake 6304.27 ml  Output 100 ml  Net 6204.27 ml   Last Weight  Most recent update: 01/31/2023  6:19 AM    Weight  64.2 kg (141 lb 8.6 oz)            Gen:  Elderly chronically ill appearing elderly female HEENT: moist mucous membranes CV: Regular rate and rhythm  PULM: On 4LPM [place] Light breathing is slightly labored ABD: soft/nontender EXT: LLE  edema  Neuro: Alert to self  PPS: 10%   This  conversation/these recommendations were discussed with patient primary care team, Dr. Denese Killings  Billing based on MDM: High ______________________________________________________ Lamarr Lulas [facility] Palliative Medicine Team Team Cell Phone: [phone] Please utilize secure chat with additional questions, if there is no response within 30 minutes please call the above phone number  Palliative Medicine Team providers are available by phone from 7am to 7pm daily and can be reached through the team cell phone.  Should this patient require assistance outside of these hours, please call the patient's attending physician.

## 2023-01-31 NOTE — Progress Notes (Signed)
eLink Physician-Brief Progress Note Patient Name: Samantha Gentry DOB: [birth year] MRN: [id number]   Date of Service  01/31/2023  HPI/Events of Note  RN reports pt has wheezing, is a smoker.  Asking for order for neb  eICU Interventions  Order placed     Intervention Category Major Interventions: Respiratory failure - evaluation and management  Oretha Milch 01/31/2023, 12:26 AM

## 2023-01-31 NOTE — Progress Notes (Signed)
   Palliative Medicine Inpatient Follow Up Note   I met with patient's family at bedside.  She was joined by all 5 of her grandchildren and her 2 daughters.  Support was provided by both Tillman Sers, NP, Stephanie Acre chaplain, and myself.  Discussed what will happen in terms of changing her focus to comfort mediated after family has the opportunity to visit.  Patient has had increased pressor requirements throughout the day therefore open and honest conversations were held that time will likely be quite limited.  Alvino Chapel was able to offer prayer.  Palliative support provided.  SUMMARY OF RECOMMENDATIONS   DNAR/DNI  Family visiting this early evening  Comfort care orders have been written though pressor support will not be discontinued until grandchildren have left  Ongoing palliative support  Additional time Spent: 42 Billing based on MDM: High  ______________________________________________________________________________________ Lamarr Lulas [facility] Palliative Medicine Team Team Cell Phone: [phone] Please utilize secure chat with additional questions, if there is no response within 30 minutes please call the above phone number  Palliative Medicine Team providers are available by phone from 7am to 7pm daily and can be reached through the team cell phone.  Should this patient require assistance outside of these hours, please call the patient's attending physician.

## 2023-01-31 NOTE — Progress Notes (Signed)
This chaplain responded to PMT NP-Michelle referral for spiritual care as the family visits before transitioning the Pt. to comfort care. The chaplain was updated by the PMT and RN-Karye before the visit.   The Pt. two daughters and grandchildren are at the bedside. The Pt. grandchildren know her as "Nanny".  The chaplain understands the grandchildren have day to day memories of the Pt. The space is quiet as each family member grieves in their own way.   The chaplain will revisit as guided by the PMT.  **1635 Family is gathered around the bedside and speaks of the gift of the Pt. acknowledging each person's presence. The chaplain accepted the invitation to pray with the Pt. and family. The chaplain introduced the ritual of making hand prints with the Pt. The chaplain left the materials with the RN for use at the appropriate time.  Chaplain Stephanie Acre [phone]

## 2023-01-31 NOTE — Progress Notes (Signed)
Family stepped outside of patient's room and instructed to proceed with comfort measures and removing medical devices for comfort as well as turning off cardiac medications. Family requested to not be at bedside and asked for a phone update with patient's passing when that occurs. Simpson,NP updated. Dilaudid drip titrated for comfort.

## 2023-02-01 LAB — BPAM FFP
Blood Product Expiration Date: 202405052359
Blood Product Expiration Date: 202405052359
ISSUE DATE / TIME: 202404300249

## 2023-02-01 LAB — PREPARE FRESH FROZEN PLASMA
Unit division: 0
Unit division: 0

## 2023-02-01 LAB — CULTURE, BLOOD (ROUTINE X 2): Special Requests: ADEQUATE

## 2023-02-01 LAB — BODY FLUID CULTURE W GRAM STAIN

## 2023-02-02 LAB — CULTURE, BLOOD (ROUTINE X 2): Culture: NO GROWTH

## 2023-02-02 LAB — BODY FLUID CULTURE W GRAM STAIN

## 2023-03-02 NOTE — Discharge Summary (Signed)
DEATH SUMMARY   Patient Details  Name: Samantha Gentry MRN: [id number] DOB: 08-15-1946  Admission/Discharge Information   Admit Date:  Feb 09, 2023  Date of Death: Date of Death: 02/13/2023  Time of Death: Time of Death: 0301  Length of Stay: 4  Referring Physician: Patient, No Pcp Per   Reason(s) for Hospitalization  Generalized weakness and hypotension  Diagnoses  Preliminary cause of death: Fulminant hepatic failure Secondary Diagnoses (including complications and co-morbidities):  Principal Problem:   SIRS (systemic inflammatory response syndrome) (HCC) Active Problems:   Hepatic cirrhosis (HCC)   Shock (HCC)   AKI (acute kidney injury) (HCC)   Lactic acidosis   Septic shock (HCC)   SBP (spontaneous bacterial peritonitis) (HCC)   SVT (supraventricular tachycardia)   Pressure injury of skin   Protein-calorie malnutrition, severe Hepatocellular carcinoma  Brief Hospital Course (including significant findings, care, treatment, and services provided and events leading to death)  Samantha Gentry is a 77 y.o. year old female who had experienced declining health over the past 3 months with increasing fatigue.  She was admitted to hospital with generalized weakness and was found to have lactic acidosis.  Paracentesis was performed which showed evidence of SBP for which she was started on antibiotics.  She required vasopressors to maintain normal blood pressure.  She was found to have worsening coagulopathy with evidence of intravascular hemolysis consistent with DIC.  Vasopressor requirements continued to increase as the patient's confusion.  Given her worsening anemia she underwent a CT scan of the abdomen which showed evidence of several large hepatic masses and several other more distant masses consistent with metastatic disease.  Given history of longstanding cirrhosis and recent progressive decompensation thought was that these represented hepatocellular carcinoma.  Given that  she was progressively worsening from sepsis with worsening liver function consistent with fulminant hepatic failure in the context of advanced cirrhosis with evidence of uncurable malignancy it was decided in discussions with the patient's family and the palliative care team to transition to comfort care.    Pertinent Labs and Studies  Significant Diagnostic Studies CT CHEST ABDOMEN PELVIS WO CONTRAST  Result Date: 01/30/2023 CLINICAL DATA:  Pulmonary nodule suspected on chest x-ray. EXAM: CT CHEST, ABDOMEN AND PELVIS WITHOUT CONTRAST TECHNIQUE: Multidetector CT imaging of the chest, abdomen and pelvis was performed following the standard protocol without IV contrast. RADIATION DOSE REDUCTION: This exam was performed according to the departmental dose-optimization program which includes automated exposure control, adjustment of the mA and/or kV according to patient size and/or use of iterative reconstruction technique. COMPARISON:  Chest x-ray and abdominal ultrasound 09-Feb-2023 FINDINGS: CT CHEST FINDINGS Cardiovascular: The heart is normal in size. Small pericardial effusion appears simple. The aorta is normal in caliber. Scattered atherosclerotic calcifications. Scattered coronary artery calcifications. Mediastinum/Nodes: Borderline enlarged mediastinal lymph nodes mainly in the pre-vascular space. The largest node measures 12 mm. Moderate dilatation of the esophagus without obvious mass or obstruction. Contrast in the esophagus suggesting reflux. Small hiatal hernia. Lungs/Pleura: Numerous bilateral pulmonary nodules consistent with pulmonary metastatic disease. Index right lung nodule in the superior segment of the right lower lobe on image number 39/4 measures 11 mm. 10 mm left lower lobe nodule on image number 42/4. There are bilateral pleural effusions and overlying atelectasis. No pulmonary edema. No pulmonary mass. The central tracheobronchial tree is unremarkable. Musculoskeletal: No breast masses,  supraclavicular or axillary adenopathy. The bony thorax is intact. No definite bone lesions. CT ABDOMEN PELVIS FINDINGS Hepatobiliary: Advanced cirrhotic changes involving the liver along  with several large hepatic mass lesions involving both lobes. Segment 3 lesion measures approximately 6 cm. Segment 6 lesion measures approximately 6 cm. Segment 4B lesion measures 4.2 cm. Segment 8 lesion measures 4.5 cm. Findings most likely secondary to metastatic disease. Stone filled contracted gallbladder. Pancreas: No mass, inflammation or ductal dilatation. Moderate pancreatic atrophy. Spleen: Normal size.  Scattered splenic granulomas. Adrenals/Urinary Tract: Bilateral adrenal gland lesions suggesting metastatic disease. No renal lesions or hydronephrosis. Bladder is grossly. Stomach/Bowel: The stomach is distended with fluid and contrast. No obvious gastric outlet obstruction. Possible gastroparesis. No findings for small bowel obstruction. Suspect a cecal mass. Difficult to be certain without IV or oral contrast but very irregular wall thickening. There is also an adjacent soft tissue mass/peritoneal implant in the right pericolic gutter measuring 2.4 cm on image 82/3. Enlarge right-sided retroperitoneal lymph nodes are also noted. The rectum sigmoid colon and remainder of the colon grossly. Vascular/Lymphatic: Advanced atherosclerotic calcification involving the aorta and branch vessels but no aneurysm. Numerous borderline enlarged mesenteric and retroperitoneal lymph nodes. Reproductive: No significant findings. Other: Large volume abdominal/pelvic ascites with mesenteric edema and suspected peritoneal carcinomatosis. Musculoskeletal: No obvious bone lesions. Right hip prosthesis is noted. Severe degenerative changes involving the left hip. IMPRESSION: 1. Advanced cirrhotic changes involving the liver along with several large hepatic mass lesions suggesting metastatic disease. 2. Suspect a cecal mass with adjacent soft  tissue mass/peritoneal implant in the right pericolic gutter. 3. Large volume abdominal/pelvic ascites with mesenteric edema and suspected peritoneal carcinomatosis. 4. Numerous bilateral pulmonary nodules consistent with pulmonary metastatic disease. 5. Bilateral adrenal gland lesions suggesting metastatic disease. 6. PET-CT may be helpful for further evaluation. Tissue diagnosis with left hepatic lobe lesion biopsy may be indicated. 7. Borderline enlarged mediastinal and retroperitoneal lymph nodes. 8. Bilateral pleural effusions and overlying atelectasis. 9. Small pericardial effusion. 10. Moderate dilatation of the esophagus without obvious mass or obstruction. Contrast in the esophagus suggesting reflux. 11. Cholelithiasis. 12. Advanced atherosclerotic calcification involving the aorta and branch vessels. 13. Aortic atherosclerosis. Aortic Atherosclerosis (ICD10-I70.0). Electronically Signed   By: Rudie Meyer M.D.   On: 01/30/2023 12:11   ECHOCARDIOGRAM COMPLETE  Result Date: 01/29/2023    ECHOCARDIOGRAM REPORT   Patient Name:   Krisztina Edleman Date of Exam: 01/29/2023 Medical Rec #:  [id number]         Height:       66.0 in Accession #:    [id number]        Weight:       129.0 lb Date of Birth:  1945-12-29         BSA:          1.660 m Patient Age:    77 years          BP:           87/56 mmHg Patient Gender: F                 HR:           95 bpm. Exam Location:  Inpatient Procedure: 2D Echo, Cardiac Doppler and Color Doppler Indications:    Dyspnea  History:        Patient has no prior history of Echocardiogram examinations.                 Risk Factors:Current Smoker.  Sonographer:    Milda Smart Referring Phys: Clarene Critchley [facility]  Sonographer Comments: Technically difficult study due to poor echo windows. Image acquisition challenging due  to patient body habitus and Image acquisition challenging due to respiratory motion. IMPRESSIONS  1. Left ventricular ejection fraction, by estimation, is >75%. The  left ventricle has hyperdynamic function. The left ventricle has no regional wall motion abnormalities. Left ventricular diastolic parameters are indeterminate.  2. Right ventricular systolic function is hyperdynamic. The right ventricular size is normal.  3. The mitral valve is normal in structure. No evidence of mitral valve regurgitation. No evidence of mitral stenosis.  4. The aortic valve is tricuspid. Aortic valve regurgitation is not visualized.  5. The inferior vena cava is normal in size with greater than 50% respiratory variability, suggesting right atrial pressure of 3 mmHg. Comparison(s): No prior Echocardiogram. FINDINGS  Left Ventricle: Left ventricular ejection fraction, by estimation, is >75%. The left ventricle has hyperdynamic function. The left ventricle has no regional wall motion abnormalities. The left ventricular internal cavity size was small. There is no left  ventricular hypertrophy. Left ventricular diastolic parameters are indeterminate. Right Ventricle: The right ventricular size is normal. No increase in right ventricular wall thickness. Right ventricular systolic function is hyperdynamic. Left Atrium: Left atrial size was normal in size. Right Atrium: Right atrial size was normal in size. Pericardium: There is no evidence of pericardial effusion. Mitral Valve: The mitral valve is normal in structure. No evidence of mitral valve regurgitation. No evidence of mitral valve stenosis. Tricuspid Valve: The tricuspid valve is normal in structure. Tricuspid valve regurgitation is not demonstrated. No evidence of tricuspid stenosis. Aortic Valve: The aortic valve is tricuspid. Aortic valve regurgitation is not visualized. Aortic valve mean gradient measures 7.0 mmHg. Aortic valve peak gradient measures 12.1 mmHg. Aortic valve area, by VTI measures 3.73 cm. Pulmonic Valve: The pulmonic valve was normal in structure. Pulmonic valve regurgitation is not visualized. No evidence of pulmonic  stenosis. Aorta: The aortic root, ascending aorta and aortic arch are all structurally normal, with no evidence of dilitation or obstruction. Venous: The inferior vena cava is normal in size with greater than 50% respiratory variability, suggesting right atrial pressure of 3 mmHg. IAS/Shunts: No atrial level shunt detected by color flow Doppler.  LEFT VENTRICLE PLAX 2D LVIDd:         3.20 cm   Diastology LVIDs:         1.10 cm   LV e' medial:    9.25 cm/s LV PW:         0.70 cm   LV E/e' medial:  6.3 LV IVS:        0.50 cm   LV e' lateral:   5.98 cm/s LVOT diam:     2.00 cm   LV E/e' lateral: 9.8 LV SV:         80 LV SV Index:   48 LVOT Area:     3.14 cm  RIGHT VENTRICLE RV S prime:     24.40 cm/s TAPSE (M-mode): 1.7 cm LEFT ATRIUM             Index LA diam:        3.30 cm 1.99 cm/m LA Vol (A2C):   20.7 ml 12.47 ml/m LA Vol (A4C):   19.8 ml 11.93 ml/m LA Biplane Vol: 23.1 ml 13.92 ml/m  AORTIC VALVE AV Area (Vmax):    3.18 cm AV Area (Vmean):   2.96 cm AV Area (VTI):     3.73 cm AV Vmax:           174.00 cm/s AV Vmean:  119.000 cm/s AV VTI:            0.214 m AV Peak Grad:      12.1 mmHg AV Mean Grad:      7.0 mmHg LVOT Vmax:         176.00 cm/s LVOT Vmean:        112.000 cm/s LVOT VTI:          0.254 m LVOT/AV VTI ratio: 1.19  AORTA Ao Root diam: 3.00 cm Ao Asc diam:  3.10 cm MITRAL VALVE                TRICUSPID VALVE MV Area (PHT): 3.02 cm     TR Peak grad:   10.0 mmHg MV Decel Time: 251 msec     TR Vmax:        158.00 cm/s MV E velocity: 58.60 cm/s MV A velocity: 107.00 cm/s  SHUNTS MV E/A ratio:  0.55         Systemic VTI:  0.25 m                             Systemic Diam: 2.00 cm Riley Lam MD Electronically signed by Riley Lam MD Signature Date/Time: 01/29/2023/5:11:06 PM    Final    US Abdomen Complete  Result Date: 01/21/2023 CLINICAL DATA:  [id number] Pain [id number] EXAM: ABDOMEN ULTRASOUND COMPLETE COMPARISON:  None Available. FINDINGS: Gallbladder: No gallstones.  Nonspecific gallbladder wall thickening visualized. No sonographic Murphy sign noted by sonographer. Common bile duct: Diameter: 9 mm Liver: Nodular hepatic contour. No focal lesion identified. Increased and coarsened parenchymal echogenicity. Portal vein is patent on color Doppler imaging with normal direction of blood flow towards the liver. IVC: Not visualized. Pancreas: Visualized portion unremarkable. Spleen: Size and appearance within normal limits. Right Kidney: Length: 10.3 cm. Echogenicity within normal limits. No mass or hydronephrosis visualized. Left Kidney: Length: 9.3 cm. Echogenicity within normal limits. No mass or hydronephrosis visualized. Abdominal aorta: Not visualized. Other findings: Moderate to large volume simple free fluid ascites. IMPRESSION: 1. Hepatic steatosis and cirrhosis. No focal liver lesions identified. Please note that liver protocol enhanced MR and CT are the most sensitive tests for the screening detection of hepatocellular carcinoma in the high risk setting of cirrhosis. 2. Moderate to large volume simple free fluid ascites. 3. Nonspecific gallbladder wall thickening which can be seen in the setting of chronic liver disease. 4. Abdominal aorta and IVC not visualized. Electronically Signed   By: Tish Frederickson M.D.   On: 01/17/2023 19:43   DG Chest Portable 1 View  Result Date: 01/14/2023 CLINICAL DATA:  Hypotension EXAM: PORTABLE CHEST 1 VIEW COMPARISON:  10/07/2017 FINDINGS: Multiple overlying monitoring leads. Heart size appears mildly enlarged. Aortic atherosclerosis. At least 2 small nodular densities project within the right lung, partially obscured by overlying monitoring leads. Possible small left pleural effusion. No pneumothorax. Degenerative changes of the spine and shoulders. IMPRESSION: At least 2 small nodular densities project within the right lung, partially obscured by overlying monitoring leads. Further assessment with CT of the chest is recommended.  Electronically Signed   By: Duanne Guess D.O.   On: 01/25/2023 16:29    Microbiology Recent Results (from the past 240 hour(s))  Blood culture (routine x 2)     Status: None (Preliminary result)   Collection Time: 01/10/2023  5:33 PM   Specimen: BLOOD  Result Value Ref Range Status   Specimen Description BLOOD FEMORAL ARTERY  Final   Special Requests   Final    BOTTLES DRAWN AEROBIC AND ANAEROBIC Blood Culture results may not be optimal due to an inadequate volume of blood received in culture bottles   Culture   Final    NO GROWTH 4 DAYS Performed at [facility], [address] [place], [place] [address]    Report Status PENDING  Incomplete  Blood culture (routine x 2)     Status: None (Preliminary result)   Collection Time: 2023-02-05  7:50 PM   Specimen: BLOOD  Result Value Ref Range Status   Specimen Description BLOOD LEFT ANTECUBITAL  Final   Special Requests   Final    BOTTLES DRAWN AEROBIC AND ANAEROBIC Blood Culture adequate volume   Culture   Final    NO GROWTH 4 DAYS Performed at [facility], [address] [place], [place] [address]    Report Status PENDING  Incomplete  MRSA Next Gen by PCR, Nasal     Status: None   Collection Time: 2023-02-05 11:04 PM   Specimen: Nasal Mucosa; Nasal Swab  Result Value Ref Range Status   MRSA by PCR Next Gen NOT DETECTED NOT DETECTED Final    Comment: (NOTE) The GeneXpert MRSA Assay (FDA approved for NASAL specimens only), is one component of a comprehensive MRSA colonization surveillance program. It is not intended to diagnose MRSA infection nor to guide or monitor treatment for MRSA infections. Test performance is not FDA approved in patients less than 54 years old. Performed at [facility], [address], [place], [place] [address]   Body fluid culture w Gram Stain     Status: Abnormal (Preliminary result)   Collection Time: 01/29/23 12:31 AM   Specimen: Paracentesis; Pleural Fluid  Result Value Ref  Range Status   Specimen Description PARACENTESIS  Final   Special Requests NONE  Final   Gram Stain   Final    MODERATE WBC PRESENT,BOTH PMN AND MONONUCLEAR NO ORGANISMS SEEN CRITICAL RESULT CALLED TO, READ BACK BY AND VERIFIED WITH: RN Duard Larsen [phone] [id number] BY J RAZZAK,MT    Culture (A)  Final    STREPTOCOCCUS CONSTELLATUS SUSCEPTIBILITIES TO FOLLOW Performed at [facility], [address] [place], [place] [address]    Report Status PENDING  Incomplete    Lab Basic Metabolic Panel: Recent Labs  Lab 05-Feb-2023 1557 Feb 05, 2023 1741 2023/02/05 1759 02/05/23 1950 01/29/23 0741 01/30/23 0143 01/31/23 0300  NA 129* 129* 129*  --  134* 129* 126*  K 4.8 4.8 4.9  --  4.5 4.9 3.8  CL 94* 100  --   --  100 98 88*  CO2 13*  --   --   --  14* 15* 25  GLUCOSE 50* 44*  --   --  109* 122* 160*  BUN 34* 34*  --   --  39* 45* 54*  CREATININE 2.29* 2.30*  --   --  2.24* 2.55* 2.44*  CALCIUM 8.2*  --   --   --  8.0* 7.5* 7.3*  MG 2.1  --   --   --  1.9 2.0  --   PHOS  --   --   --  4.7* 5.0*  --   --    Liver Function Tests: Recent Labs  Lab Feb 05, 2023 1557 01/29/23 0741 01/30/23 0143 01/31/23 0300  AST 76* 64* 106* 85*  ALT 47* 36 48* 47*  ALKPHOS 92 59 47 44  BILITOT  3.5* 3.3* 3.0* 6.1*  PROT 5.1* 4.3*  4.4* 3.9* 3.6*  ALBUMIN 1.8* 1.7* 2.0* 1.5*   No results for input(s): "LIPASE", "AMYLASE" in the last 168 hours. Recent Labs  Lab 01/11/2023 1733 01/30/23 0632 01/31/23 1137  AMMONIA 23 35 70*   CBC: Recent Labs  Lab 01/23/2023 1557 01/02/2023 1741 01/29/23 0447 01/30/23 0639 01/30/23 1213 01/30/23 1348 01/31/23 0300 01/31/23 1137 01/31/23 1150  WBC 28.9*  --  18.1* 29.0* 27.1*  --  32.5*  --   --   NEUTROABS 27.2*  --   --   --   --   --   --   --   --   HGB 7.8*   < > 7.4* 4.8* 10.2* 10.1* 9.1* 9.2*  --   HCT 26.3*   < > 25.8* 15.6* 30.5* 30.1* 26.4* 26.9*  --   MCV 77.1*  --  78.4* 75.7* 74.8*  --  71.9*  --   --   PLT 232  --  162 PLATELETS APPEAR  DECREASED 80*  76*  --  48*  --  46*   < > = values in this interval not displayed.   Cardiac Enzymes: Recent Labs  Lab 12/31/2022 1950  CKTOTAL 127   Sepsis Labs: Recent Labs  Lab 01/04/2023 1950 01/29/23 0447 01/29/23 0741 01/29/23 1555 01/30/23 0632 01/30/23 0639 01/30/23 1213 01/31/23 0300  PROCALCITON  --   --  11.53  --   --   --   --   --   WBC  --  18.1*  --   --   --  29.0* 27.1* 32.5*  LATICACIDVEN >9.0* >9.0*  --  >9.0* 9.0*  --   --   --     Procedures/Operations  CT abdomen.   Trevor Duty 2023/02/09, 10:55 AM

## 2023-03-02 NOTE — Progress Notes (Signed)
eLink Physician-Brief Progress Note Patient Name: Samantha Gentry DOB: [birth year] MRN: [id number]   Date of Service  12-Feb-2023  HPI/Events of Note  77 year old female who presented with septic versus hypovolemic shock developed SVT into the 200s with minimal response to adenosine, amiodarone was found to be severely anemic.  Workup revealed disease consistent with metastatic carcinoma with ascites likely hepatic origin.  Patient was delirious but family opted to pursue comfort measures only on 5/2.  eICU Interventions  Time of death 0301 on 02-12-23      Intervention Category Minor Interventions: Other:  Adam Demary 2023/02/12, 3:25 AM

## 2023-03-02 DEATH — deceased
